# Patient Record
Sex: Male | Born: 1970
Health system: Southern US, Community
[De-identification: ages and names within clinical notes are randomized; demographics above are authoritative.]

## PROBLEM LIST (undated history)

## (undated) DIAGNOSIS — I2510 Atherosclerotic heart disease of native coronary artery without angina pectoris: Secondary | ICD-10-CM

## (undated) DIAGNOSIS — I10 Essential (primary) hypertension: Secondary | ICD-10-CM

## (undated) HISTORY — PX: CARDIAC CATHETERIZATION: SHX172

## (undated) HISTORY — PX: CHOLECYSTECTOMY: SHX55

## (undated) HISTORY — DX: Essential (primary) hypertension: I10

## (undated) HISTORY — PX: GASTRIC BYPASS: SHX52

---

## 1999-03-07 ENCOUNTER — Emergency Department (HOSPITAL_COMMUNITY): Admission: EM | Admit: 1999-03-07 | Discharge: 1999-03-07 | Payer: Self-pay | Admitting: Emergency Medicine

## 2000-07-13 ENCOUNTER — Encounter: Payer: Self-pay | Admitting: Emergency Medicine

## 2000-07-13 ENCOUNTER — Emergency Department (HOSPITAL_COMMUNITY): Admission: EM | Admit: 2000-07-13 | Discharge: 2000-07-13 | Payer: Self-pay | Admitting: Emergency Medicine

## 2000-11-03 ENCOUNTER — Emergency Department (HOSPITAL_COMMUNITY): Admission: EM | Admit: 2000-11-03 | Discharge: 2000-11-03 | Payer: Self-pay | Admitting: Emergency Medicine

## 2002-07-17 ENCOUNTER — Encounter: Payer: Self-pay | Admitting: Emergency Medicine

## 2002-07-17 ENCOUNTER — Emergency Department (HOSPITAL_COMMUNITY): Admission: EM | Admit: 2002-07-17 | Discharge: 2002-07-17 | Payer: Self-pay | Admitting: Emergency Medicine

## 2002-08-25 ENCOUNTER — Encounter (HOSPITAL_COMMUNITY): Admission: RE | Admit: 2002-08-25 | Discharge: 2002-11-23 | Payer: Self-pay | Admitting: Cardiovascular Disease

## 2002-08-25 ENCOUNTER — Encounter: Payer: Self-pay | Admitting: Cardiovascular Disease

## 2002-09-17 ENCOUNTER — Encounter: Payer: Self-pay | Admitting: Cardiovascular Disease

## 2002-09-17 ENCOUNTER — Ambulatory Visit (HOSPITAL_COMMUNITY): Admission: RE | Admit: 2002-09-17 | Discharge: 2002-09-17 | Payer: Self-pay | Admitting: Cardiovascular Disease

## 2003-05-06 ENCOUNTER — Emergency Department (HOSPITAL_COMMUNITY): Admission: EM | Admit: 2003-05-06 | Discharge: 2003-05-06 | Payer: Self-pay | Admitting: Emergency Medicine

## 2003-05-06 ENCOUNTER — Encounter: Payer: Self-pay | Admitting: Emergency Medicine

## 2004-04-18 ENCOUNTER — Emergency Department (HOSPITAL_COMMUNITY): Admission: EM | Admit: 2004-04-18 | Discharge: 2004-04-18 | Payer: Self-pay | Admitting: Family Medicine

## 2004-09-09 ENCOUNTER — Emergency Department (HOSPITAL_COMMUNITY): Admission: EM | Admit: 2004-09-09 | Discharge: 2004-09-09 | Payer: Self-pay | Admitting: Emergency Medicine

## 2004-10-16 ENCOUNTER — Emergency Department (HOSPITAL_COMMUNITY): Admission: EM | Admit: 2004-10-16 | Discharge: 2004-10-16 | Payer: Self-pay | Admitting: Family Medicine

## 2005-09-20 ENCOUNTER — Ambulatory Visit (HOSPITAL_COMMUNITY): Admission: RE | Admit: 2005-09-20 | Discharge: 2005-09-20 | Payer: Self-pay | Admitting: Internal Medicine

## 2005-09-24 ENCOUNTER — Inpatient Hospital Stay (HOSPITAL_COMMUNITY): Admission: EM | Admit: 2005-09-24 | Discharge: 2005-09-27 | Payer: Self-pay | Admitting: Emergency Medicine

## 2005-09-26 ENCOUNTER — Encounter (INDEPENDENT_AMBULATORY_CARE_PROVIDER_SITE_OTHER): Payer: Self-pay | Admitting: Specialist

## 2006-05-23 ENCOUNTER — Emergency Department (HOSPITAL_COMMUNITY): Admission: EM | Admit: 2006-05-23 | Discharge: 2006-05-23 | Payer: Self-pay | Admitting: Family Medicine

## 2006-12-11 ENCOUNTER — Emergency Department (HOSPITAL_COMMUNITY): Admission: EM | Admit: 2006-12-11 | Discharge: 2006-12-11 | Payer: Self-pay | Admitting: Family Medicine

## 2007-03-01 IMAGING — CR DG CHEST 2V
2 series · 2 of 2 positions shown · non-contrast
Comparison: 05/06/03.

CLINICAL DATA: Positive TB skin test. 
 CHEST - 2 VIEW:

[view not recorded (1 of 2)]
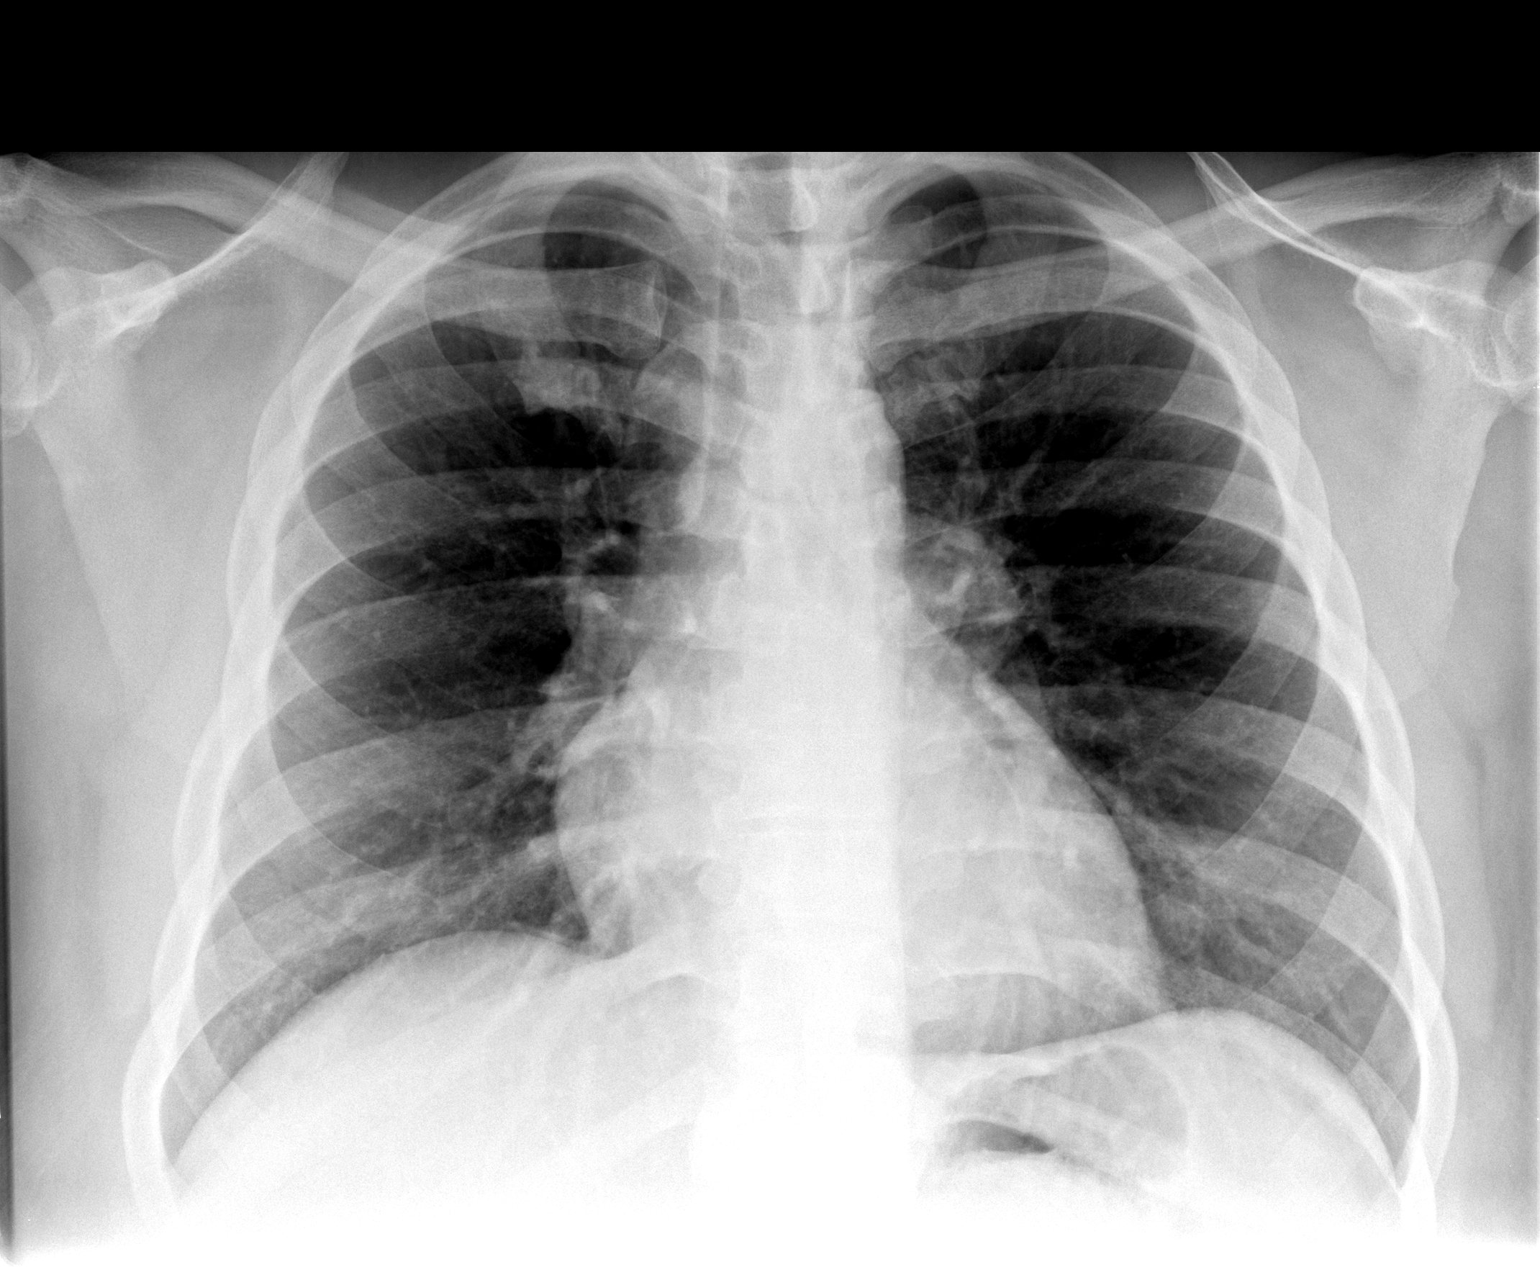

[view not recorded (2 of 2)]
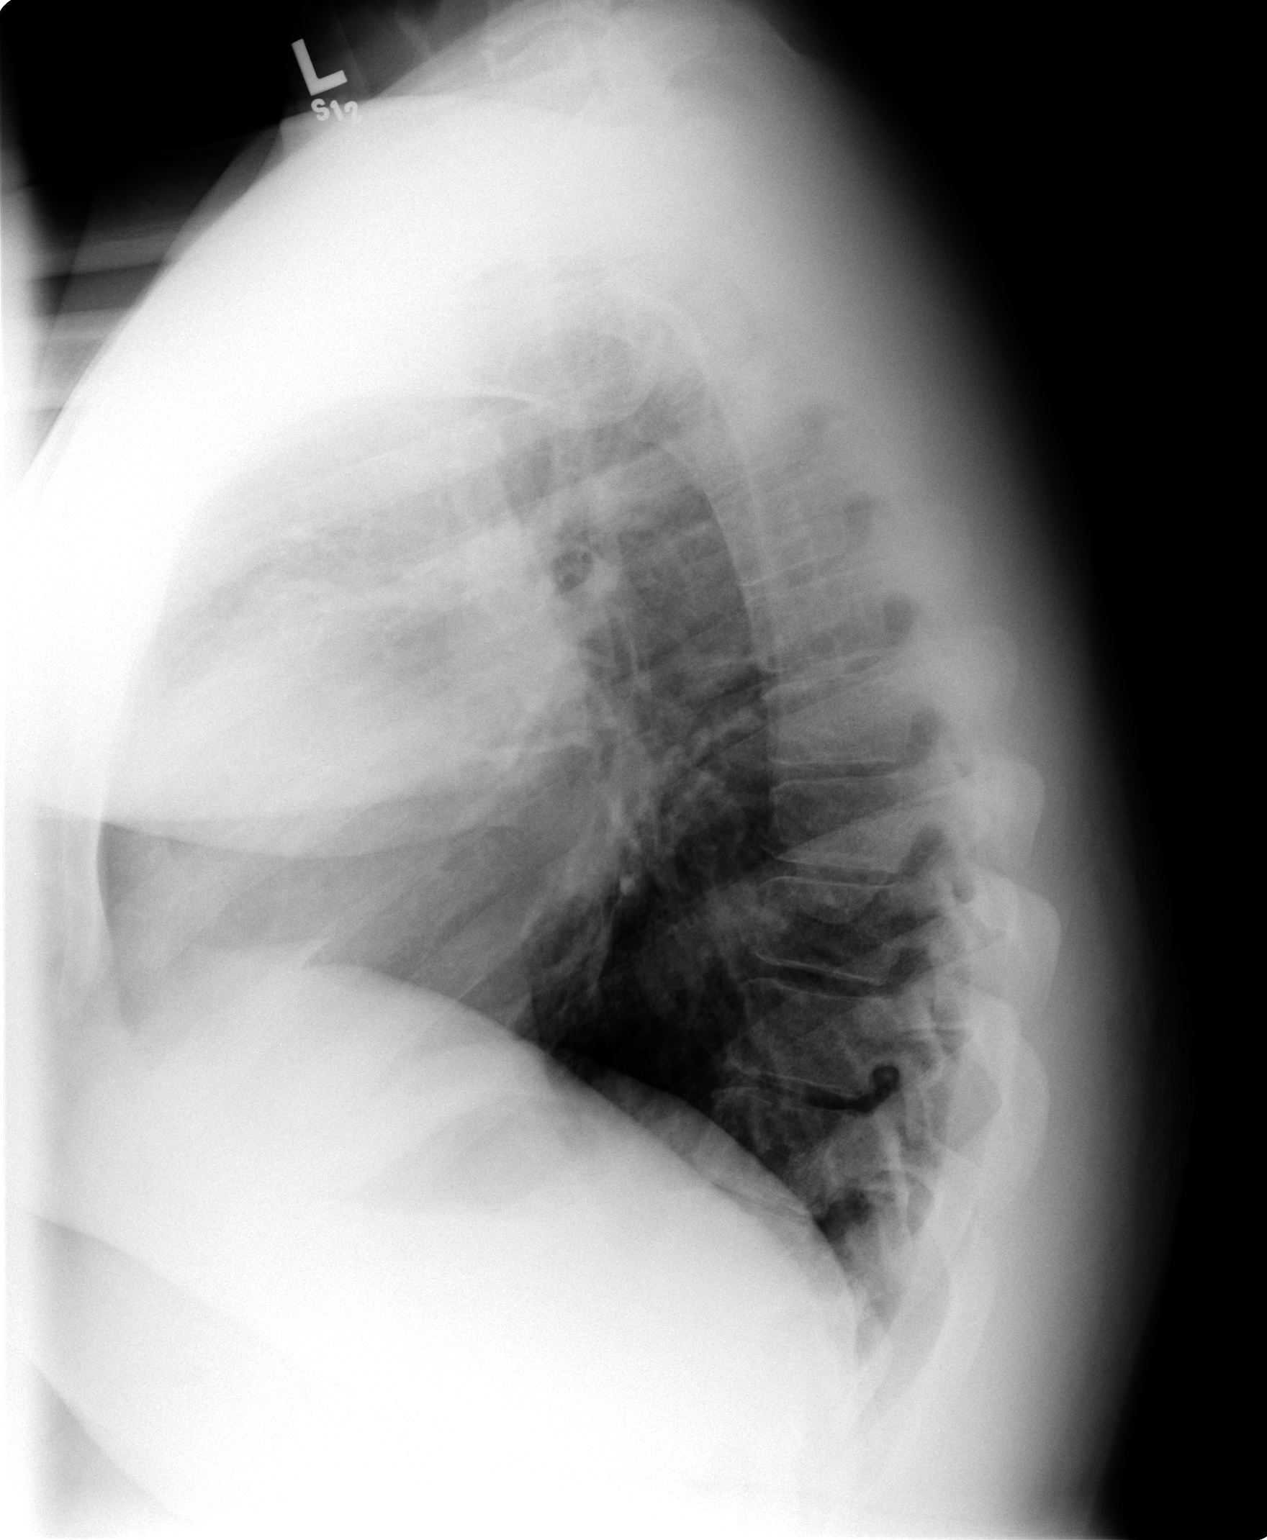

[2 of 2 positions shown; findings below may reference images not displayed]

Two views of the chest show the lungs to be clear.  The heart is within normal limits in size.  Appearance of the left hilum is stable.  No bony abnormality is seen.
IMPRESSION: Stable chest x-ray.  No active lung disease.

## 2007-03-07 IMAGING — RF DG CHOLANGIOGRAM OPERATIVE
1 series · 4 of 4 positions shown · non-contrast
Comparison: none

CLINICAL DATA: Acute cholecystitis. 
 OPERATIVE CHOLANGIOGRAM:
 C-Arm run is reviewed on PACS.  Intra and extrahepatic biliary ductal system normal in caliber with no filling defects or obstruction.

[Series 1: run · 4 of 42 frames shown]
[frame 7/42]
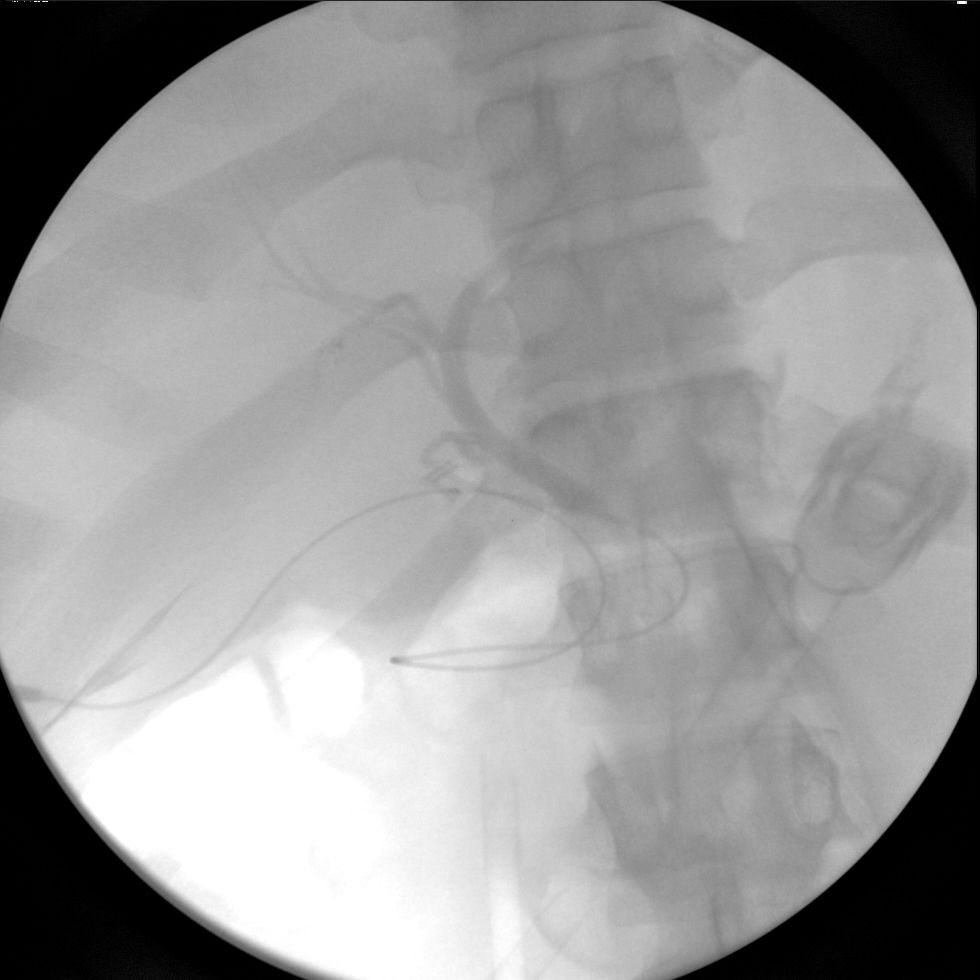
[frame 22/42]
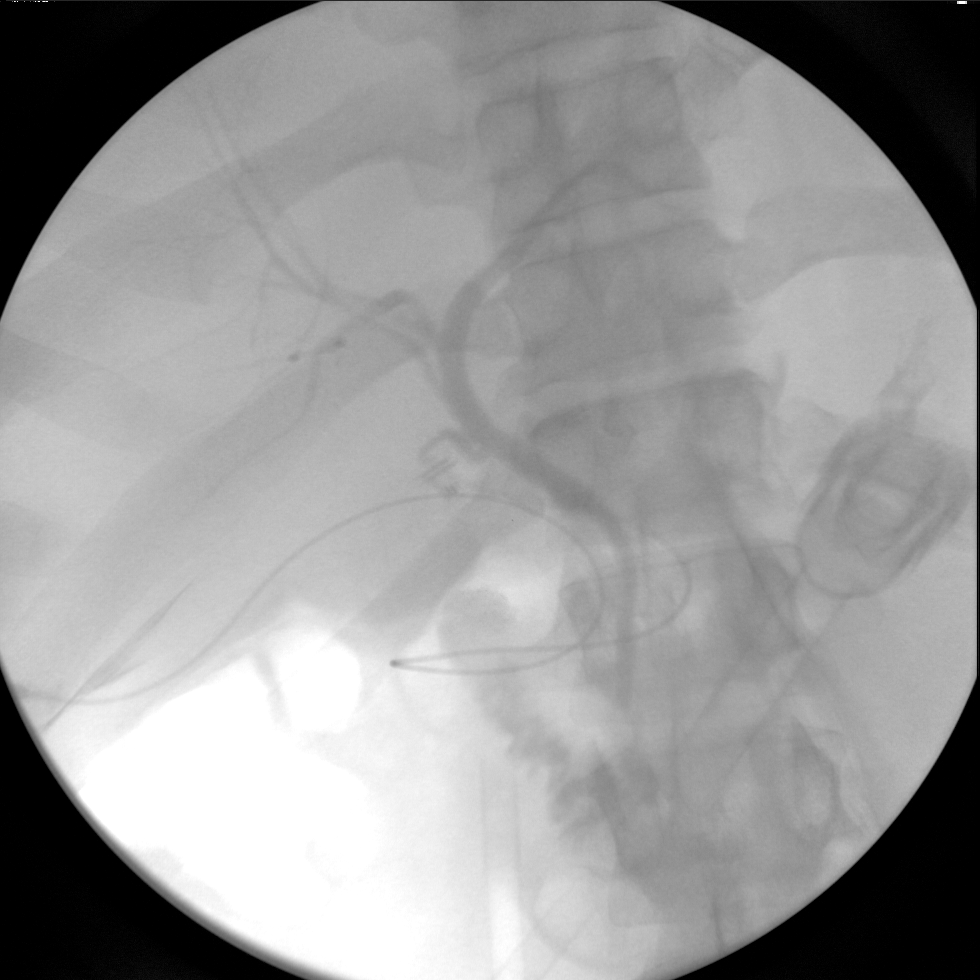
[frame 36/42]
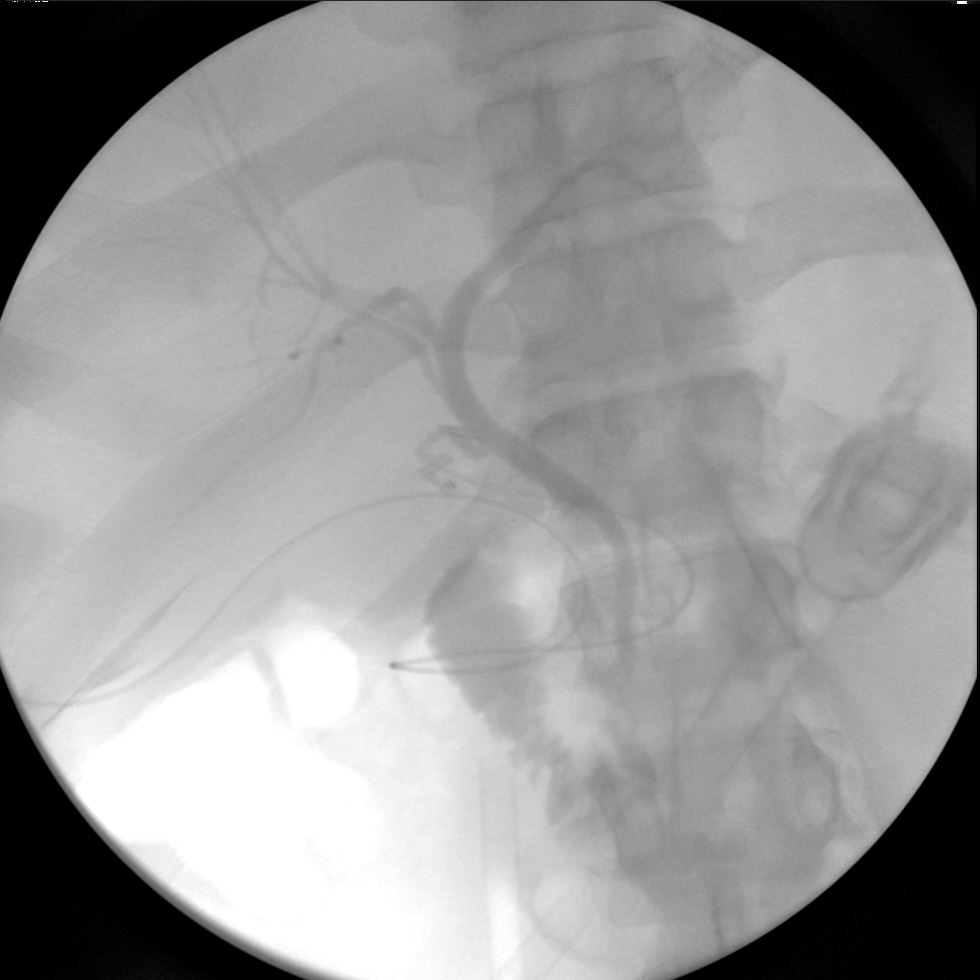
[frame 42/42]
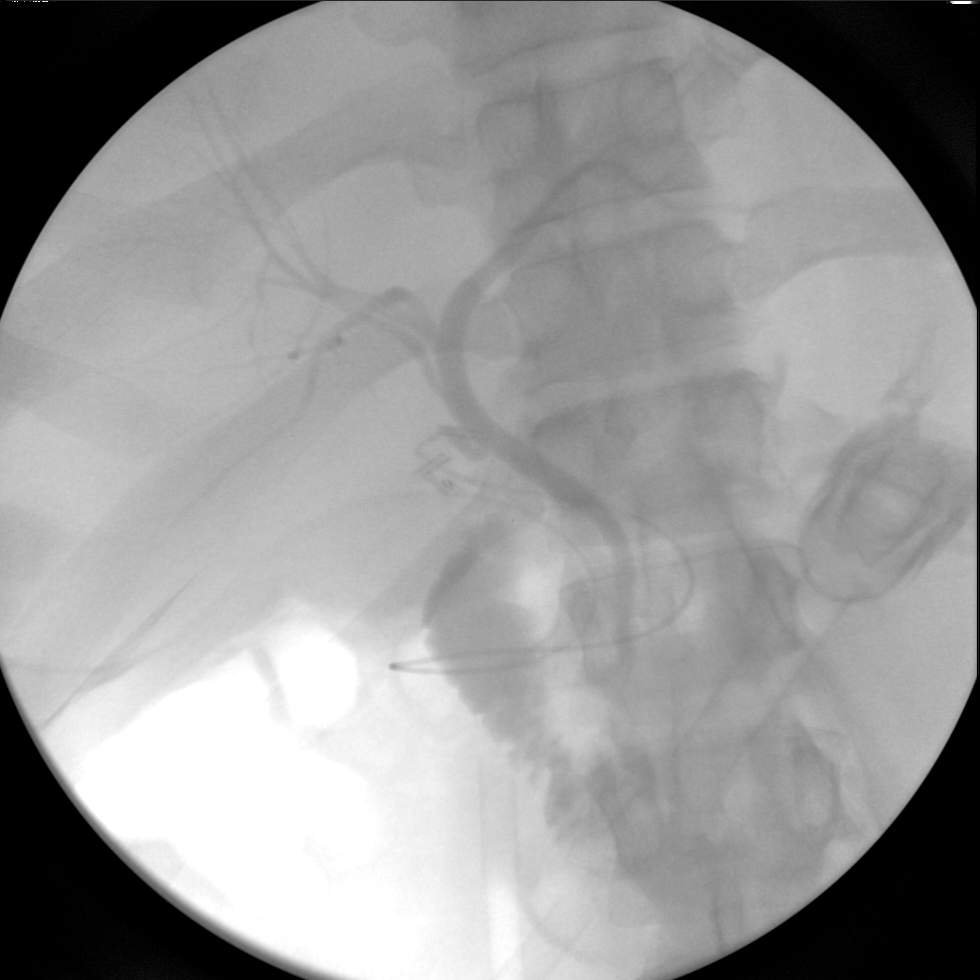

[4 of 4 positions shown; findings below may reference images not displayed]

IMPRESSION: Satisfactory operative cholangiogram.

## 2007-05-15 ENCOUNTER — Emergency Department (HOSPITAL_COMMUNITY): Admission: EM | Admit: 2007-05-15 | Discharge: 2007-05-15 | Payer: Self-pay | Admitting: Family Medicine

## 2011-01-04 NOTE — Op Note (Signed)
[name], [name]:  [id number]   MEDICAL RECORD NO.:  [id number]          PATIENT TYPE:  INP   LOCATION:  4713                         FACILITY:  [facility]   PHYSICIAN:  Cherylynn Ridges, M.D.    DATE OF BIRTH:  1971/05/20   DATE OF PROCEDURE:  09/26/2005  DATE OF DISCHARGE:                                 OPERATIVE REPORT   PREOP DIAGNOSIS:  Acute cholecystitis and cholelithiasis.   POSTOP DIAGNOSIS:  Acute cholecystitis and cholelithiasis.   PROCEDURE:  Laparoscopic cholecystectomy with cholangiogram.   SURGEON:  Cherylynn Ridges, M.D.   ANESTHESIA:  General endotracheal.   ASSISTANT:  Dr. Janee Morn.   ESTIMATED BLOOD LOSS:  Less than 30 mL.   COMPLICATIONS:  None.   CONDITION:  Stable.   FINDINGS:  A moderate acute cholecystitis with omental adhesions. Normal  cholangiogram.   INDICATIONS FOR OPERATION:  The patient is a 40 year old with multiple  stones and acute cholecystitis with abnormal liver function tests; and  increased white blood cell count who comes in now for semi elective  laparoscopic cholecystectomy.   FINDINGS:  The patient had multiple adhesions of the omentum to the  gallbladder wall. The cholangiogram was negative.   OPERATION:  The patient was taken to the operating room, placed on the table  in the supine position. After an adequate general anesthetic was  administered, he was prepped and draped in usual sterile manner exposing  midline and the right upper quadrant. A supraumbilical curvilinear incision  was made using an #11 blade and taken down to the midline fascia. The fascia  was grasped with Kocher clamps and then an incision made between the clamps,  into the fascia using a #15 blade. We then bluntly dissected down into the  peritoneal cavity and then placed a pursestring suture of #0 Vicryl using  the UR-6 needle. We then used a Hasson cannula to pass into the peritoneal  cavity. With the cannula in adequate position  the abdomen was insufflated  with carbon dioxide gas up to a maximal intra-abdominal pressure of 50 mmHg.   Once this was done, two right costal margin 5-mm cannulas and a subxiphoid  an 11/12-mm cannula were passed under direct vision. We then placed the  patient in reverse Trendelenburg; the left-side was turned down; and the  dissection begun. There were multiple adhesions of surrounding structures  and omentum to the dome and the body of the gallbladder all the way down to  the infundibulum. We had to bluntly and sharply dissect these away with  laparoscopic scissors, cautery, and dissectors. We were able to eventually  clear out the dome and retract it towards the right upper quadrant and  anterior abdominal wall as we placed a second grasper on the infundibulum.  This freed up the peritoneum overlying the triangle of Calot, and hepatic  duodenal triangle. We dissected out the cystic duct and the cystic artery;  and we clipped the cystic artery proximally with two clips, and distally  with a single clip. We passed a single clip along with the cystic duct side  of the gallbladder,  onto the cystic duct, and then made a  cholecystodochotomy through stones which had passed into the proximal  portion of the cystic duct had extruded. We removed these stones, and  subsequently passed a Cook catheter into the cholecystodochotomy; performed  a cholangiogram which showed a good flow into the duodenum, no obstruction,  no intraductal defects.   We subsequently removed the clip and the catheter from the  cholecystodochotomy, then endoclipped the distal cystic duct using multiple  endoclips. We then transected the cystic duct; and dissected out the  gallbladder, from its bed, with minimal difficulty, although there was  entrance into the gallbladder with spillage of a small amount of bile and  small stones.  These spilled gallstones were removed using a large Nejat  aspirator; removing and  irrigated most of these substances. This was done  with about 3 liters saline.   Once we had dissected the gallbladder completely, we passed it through an  EndoCatch bag and brought it out through the supraumbilical fascia with  minimal difficulty. We tied off the supraumbilical incision using the figure-  of-eight stitch of pursestring suture of #0 Vicryl. We then irrigated  copiously, removed all fragments of stones, obtained hemostasis; and then  closed by aspirating the fluid above the liver and gas. Each incision site  was injected with 1/4% Marcaine with epinephrine. The skin was closed using  running subcuticular stitch of 4-0 Vicryl. All needle counts, sponge counts,  and instrument counts were correct. Sterile dressings were applied.      Cherylynn Ridges, M.D.  Electronically Signed     JOW/MEDQ  D:  09/26/2005  T:  09/26/2005  Job:  [id number]

## 2011-01-04 NOTE — Consult Note (Signed)
NAMEMITCHELLE, [name]:  [id number]   MEDICAL RECORD NO.:  [id number]          PATIENT TYPE:  INP   LOCATION:  4713                         FACILITY:  [facility]   PHYSICIAN:  Ricki Rodriguez, M.D.  DATE OF BIRTH:  [birth year]   DATE OF CONSULTATION:  09/24/2005  DATE OF DISCHARGE:                                   CONSULTATION   Patient admitted by [facility].   PRINCIPAL DIAGNOSES:  1.  Abnormal EKG.  2.  Acute cholecystitis.  3.  Hypertension.  4.  Obesity.  5.  Mild coronary artery disease.   RECOMMENDATIONS:  1.  This patient has a significant EKG change over a two-year period, hence      will schedule him for a Persantine Myoview stress test and keep him      n.p.o. overnight unless he needs emergent gallbladder operation.  2.  CK, MB and troponin I to rule out MI.  3.  Continue antihypertensive medication Norvasc.  4.  IV fluids.   HISTORY:  This 40 year old black male with a known history of mild coronary  artery disease found on cardiac catheterization done in January 2004 has  lost approximately 140 pounds over a two-year period.  Today he presents to  the emergency room with a five-day history of nausea, abdominal pain, right  upper and lower quadrant area without fever, vomiting or diarrhea.  Admits  to some exertional dyspnea.   PAST MEDICAL HISTORY:  Negative for diabetes.  Positive for hypertension.  No history of smoking, alcohol intake, elevated cholesterol level,  myocardial infarction, exercise, family history of premature coronary artery  disease or street drug use.  Positive history of obesity in spite of losing  140 pounds.   PAST SURGICAL HISTORY:  None.   CURRENT MEDICATIONS:  Norvasc 5 mg daily.   ALLERGIES:  No known drug allergies.   PERSONAL HISTORY:  The patient is married.  He works in Doctor, hospital at [employer].  He is here with his sister, [name].   FAMILY HISTORY:  Mother living,  age 48.  Has hypertension.  Father living,  age 92 and apparently healthy.  The patient has one brother and two sisters.   REVIEW OF SYSTEMS:  The patient admits to significant weight loss.  Positive  history of recent change requiring glasses.  No history of eye surgery, no  history of hearing loss, rhinorrhea or denture use, cough, hemoptysis,  asthma, COPD, pneumonias, palpitations, dizziness, chest pain, edema,  diarrhea, constipation, GI bleed, stomach ulcer, hiatal hernia, hepatitis,  blood transfusion, kidney stones, stroke, seizure, psychiatric admissions,  joint pain or skin rash.  Positive history of some exertional dyspnea and  recent nausea and vomiting for four to five days.   IMMUNIZATION:  Negative for flu shot and pneumonia shot.   PHYSICAL EXAMINATION:  VITAL SIGNS:  Temperature 98.6, pulse 84,  respirations 20, blood pressure 129/79.  The patient is 6 feet 1 inch tall,  weighs 258 pounds.  GENERAL:  He is alert and oriented x3 with an oxygen saturation of 98%.  HEENT:  The  patient is normocephalic, atraumatic.  Has a shaved head with  frontal baldness.  Eyes brown, equal and reacting to light.  Extraocular  movement intact.  Conjunctivae pink.  Sclerae nonicteric.  Ears, nose and  throat:  Grossly unremarkable.  NECK:  No JVD, no bruit, full range of motion of neck.  LUNGS:  Clear bilaterally.  CARDIAC:  Normal S1, S2.  ABDOMEN:  Distended, soft, with right upper quadrant area tenderness on  palpation.  EXTREMITIES:  Negative cyanosis or clubbing, edema.  CENTRAL NERVOUS SYSTEM:  Cranial nerves grossly intact, bilateral equal  grips.   LABORATORY DATA:  WBC elevated at 11,800, normal hemoglobin, hematocrit,  platelet count.  Normal electrolytes with borderline low sodium of 133,  glucose 80, BUN 9, creatinine 1.1.  Liver enzymes:  Slightly elevated AST of  40, ALT elevated at 154, alkaline phosphatase 66, bilirubin elevated at 2.4.  Amylase normal, lipase normal.   Urinalysis:  Positive urobilinogen and  nitrite positive, moderate bilirubin, specific gravity 1.033, pH 6.  Wbc's  in the urine 0-2.  Myoglobin 68, CK-MB less than 1, troponin I less than  0.05.  EKG:  Sinus rhythm with inferolateral ischemic change compared to old EKG of  2004.   Thank you for the consultation.      Ricki Rodriguez, M.D.  Electronically Signed     ASK/MEDQ  D:  09/24/2005  T:  09/25/2005  Job:  [id number]

## 2011-01-04 NOTE — Cardiovascular Report (Signed)
   NAME:  [name], [name]                      ACCOUNT NO.:  [id number]   MEDICAL RECORD NO.:  [id number]                   PATIENT TYPE:  OIB   LOCATION:  2899                                 FACILITY:  [facility]   PHYSICIAN:  Ricki Rodriguez, M.D.               DATE OF BIRTH:  09-08-1970   DATE OF PROCEDURE:  09/17/2002  DATE OF DISCHARGE:                              CARDIAC CATHETERIZATION   PROCEDURE:  Left heart catheterization, selective coronary angiography, left  ventricular function study.   INDICATION:  This 40 year old black male with risk factors of hypertension  and obesity, has abnormal nuclear stress test and symptoms of exertional  angina.   APPROACH:  Right femoral artery using 6-French diagnostic catheters.  Perclose suture applied, additional 5 to 10 minutes of pressure also held  and IV Ancef 1 g ordered.   COMPLICATIONS:  None, however, technician assisted with arterial stick.   HEMODYNAMIC DATA:  The left ventricular pressure was 132 x 10 x 15 mmHg.  The aortic pressure was 132 x 95 x 130 mmHg.   LEFT VENTRICULOGRAM:  The left ventriculogram showed normal left ventricular  systolic function.  The ejection fraction was 60-65%.   CORONARY ANATOMY:  1. The left main coronary artery was short.  2. Left anterior descending coronary artery:  The left anterior descending     coronary artery was normal with a large diagonal 1 vessel and a first     septal perforator vessel.  3. Left circumflex coronary artery:  The left circumflex coronary artery had     ostial 30% narrowing, reduced to 10-20% narrowing post intracoronary 100     mcg nitroglycerin x2.  The patient had chest pain during the dye     injection.  4. Right coronary artery:  The right coronary artery was dominant and a     large vessel with superior angulation off the ostium of the artery,     otherwise, was unremarkable.   IMPRESSION:  1. Mild one-vessel coronary artery disease with some spasm.  2. Normal left ventricular systolic function.  3. Hypertension.    RECOMMENDATION:  This patient will be started on low-acting calcium channel  blocker medication, along with a cholesterol-lowering agent, aspirin, diet  and exercise and he will be given IV Ancef 1 g for the procedure.                                                 Ricki Rodriguez, M.D.    ASK/MEDQ  D:  09/17/2002  T:  09/17/2002  Job:  [id number]   cc:   [facility]

## 2011-01-04 NOTE — H&P (Signed)
[name], [name]:  [id number]   MEDICAL RECORD NO.:  [id number]          PATIENT TYPE:  INP   LOCATION:  1846                         FACILITY:  [facility]   PHYSICIAN:  Cherylynn Ridges, M.D.    DATE OF BIRTH:  April 04, 1971   DATE OF ADMISSION:  09/24/2005  DATE OF DISCHARGE:                                HISTORY & PHYSICAL   CONSULTING PHYSICIAN:  Ricki Rodriguez, M.D.   CHIEF COMPLAINT:  Abdominal pain.   HISTORY OF PRESENT ILLNESS:  [name] is a 40 year old male with history  of hypertension and single-vessel coronary artery disease found on  catheterization in 2004 involving the circumflex 30% lesion found and 20%  lesion after injection with intracoronary nitroglycerin. The patient came to  the ER today after experiencing prolonged epigastric abdominal pain.  In  talking with the patient, he has a history of similar abdominal pain,  epigastric location, not radiating that initially began in November 2006.  He states when he would have these episodes, the pain would last for several  days.  He would have no associated symptoms, no postprandial symptoms, and  these symptoms would spontaneously resolve.  He had an additional episode in  December, and then his most recent episode started this past Friday.  He has  had constant pain with increasing and decreasing severity of pain.  Because  of the discomfort, he has limited his p.o. intake to water only.  He denies  nausea and vomiting, no diarrhea, no postprandial symptoms.  He was  evaluated in the ER today with ultrasound of the abdomen which demonstrated  a contracted gallbladder with multiple stones without ductal dilatation.   On presentation, he has some mild transaminitis with AST of 40 and ALT 154,  alkaline phosphatase 66, total bilirubin 2.4.  White count mildly elevated  at 11,800 with normal neutrophil count.  Amylase and lipase are normal.  Based on these findings, the patient has been  admitted for addition  evaluation and treatment of acute with probable chronic cholecystitis.   REVIEW OF SYSTEMS:  As per History of Present Illness.  Again, the patient  does have a cardiac history.  He denies any chest pain, shortness of breath,  or dyspnea on exertion.  He has no urinary symptoms, no trouble with  starting or stopping his urine, no lower extremity swelling or pain.  No  recent coughs, fevers, chills, or other constitutional symptoms.   FAMILY MEDICAL HISTORY:  Mother has breast cancer history, sister with  diabetes.   SOCIAL HISTORY:  He does not smoke tobacco or use tobacco products.  He  drinks alcohol socially.  He is married with three children.  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  His wife is a Designer, jewellery.   PAST MEDICAL HISTORY:  1.  Hypertension.  2.  10 to 20% nonobstructive lesion in the ostial circumflex.   PAST SURGICAL HISTORY:  The patient thinks he had some sort of surgical  procedure done at age 75, but he does not remember what.   ALLERGIES:  No known drug allergies.   CURRENT MEDICATIONS:  Norvasc 10 mg daily.   PHYSICAL EXAMINATION:  GENERAL:  This is a pleasant male currently  complaining of continuous abdominal pain, mainly epigastric location.  Does  not appear toxic.  VITAL SIGNS:  Temperature 98.3, blood pressure 131/94, pulse 104,  respirations 24.  HEENT:  Normocephalic.  Sclerae not injected.  NECK: Supple, no adenopathy.  NEUROLOGIC:  Alert and oriented, moving all extremities x4. No focal  deficits.  CHEST: Bilateral lungs clear to auscultation.  Respiratory effort not  labored.  Saturating 985 on room air.  CARDIAC:  S1, S2.  There is an S4 gallop.  No obvious murmurs, rubs, or  thrills, Pulses regular and tachycardic at times.  Carotids 2+, no bruits.  The gallop does radiate up to the right carotid.  ABDOMEN: Soft, nondistended.  There are bowel sounds present. He  is slightly  tender in the epigastric region, but he has a significantly positive  Murphy's sign in the right upper quadrant. No obvious hepatosplenomegaly.  No masses, no bruits, no umbilical hernia.  EXTREMITIES:  Symmetrical in appearance without cyanosis or clubbing.  Pulses palpable.   LABORATORY DATA:  White count 11,800, hemoglobin 14.2, hematocrit 42.3,  platelets 342,000.  Neutrophils 75%.  Stool blood negative.  Sodium 132,  potassium 4.7, chloride 100, CO2 24, glucose 80, BUN 9, creatinine 1.1, AST  40, ALT 154, alkaline phosphatase 66, total bilirubin 2.4.  Amylase 115,  lipase 36.  Point-of-care enzymes x1 is negative.  Urinalysis is negative.   DIAGNOSTICS AND ULTRASOUND:  Demonstrates contracted gallbladder with stone,  no ductal dilatation.   EKG shows sinus rhythm with downsloping ST segments in II, III, and aVF,  more pronounced in III and aVF when compared with prior EKG November 2003.  He also has some ST segment elevation in aVL and downsloping STS in V5 and  V6.  Again, these are also more pronounced than previous EKG but could also  be related to the fact that the older EKG has extremely low voltage. The  patient is currently not having chest pain.   IMPRESSION:  1.  Acute on chronic cholecystitis.  2.  Hypertension, controlled.  3.  Single-vessel coronary artery disease with questionable new      electrocardiographic changes.   PLAN:  1.  We will admit him to the telemetry floor to [facility].  2.  Allow clear liquids tonight, n.p.o. after midnight if surgery is      considered for the morning.  3.  We will hydrate aggressively with D5 half normal saline 20 K at 150 an      hour.  4.  With the leukocytosis, we will go ahead and empirically start him on      Unasyn IV q.6 h.  5.  We may need to obtain a GI consult in case an ERCP is indicated first,      noting there was no ductal dilatation on ultrasound, but the LFTs are     elevated.  I  will defer this to Dr. Lindie Spruce.  6.  I have gone ahead and notified Dr. Algie Coffer of the patient's admission      and possible need for surgery within the      next 24 to 48 hours and the fact that he has nonspecific ST changes that      need further evaluation by a cardiologist.  7.  Other recommendations per Dr. Lindie Spruce.  Allison L. Rennis Harding, N.P.      Cherylynn Ridges, M.D.  Electronically Signed    ALE/MEDQ  D:  09/24/2005  T:  09/24/2005  Job:  [id number]   cc:   Ricki Rodriguez, M.D.  Fax: [phone]

## 2011-01-04 NOTE — Cardiovascular Report (Signed)
NAMEGRADYN, SHEIN NO.:  [id number]   MEDICAL RECORD NO.:  [id number]          PATIENT TYPE:  INP   LOCATION:  4713                         FACILITY:  [facility]   PHYSICIAN:  Ricki Rodriguez, M.D.  DATE OF BIRTH:  Jul 04, 1971   DATE OF PROCEDURE:  09/25/2005  DATE OF DISCHARGE:                              CARDIAC CATHETERIZATION   PROCEDURES:  1.  Left heart catheterization.  2.  Selective coronary angiography.  3.  Left ventricular function study.   INDICATIONS FOR PROCEDURE:  This 40 year old black male with risk factors of  hypertension and obesity had abnormal nuclear stress test and abnormal EKG  on admission.   APPROACH:  Right femoral artery a 4 French sheath and catheters.   COMPLICATIONS:  None.   HEMODYNAMIC DATA:  The left ventricular pressure was 135/9/36, and aortic  pressure was 150/89.   CORONARY ANATOMY:  The left main coronary artery was short and unremarkable.   Left anterior descending coronary artery was essentially unremarkable with a  large diagonal #1 vessel and a large first septal perforated vessel.   The left circumflex coronary artery had ostial 20% narrowing, otherwise, was  unremarkable.   The right coronary artery was dominant and essentially unremarkable.  The  posterior lateral branch and posterior descending coronary artery were  smaller vessel and unremarkable.   LEFT VENTRICULOGRAM:  The left ventriculogram showed hyperdynamic wall  motion with ejection fraction of 70%.   IMPRESSION:  1.  Mild one vessel native vessel coronary artery disease.  2.  Normal left ventricular systolic function.  3.  Hypertension.   RECOMMENDATIONS:  This patient will continue with antihypertensive  medications and he will be cleared for his gallbladder surgery.      Ricki Rodriguez, M.D.  Electronically Signed     ASK/MEDQ  D:  09/25/2005  T:  09/25/2005  Job:  [id number]

## 2011-01-04 NOTE — Discharge Summary (Signed)
NAMEJULIO, [name]:  [id number]   MEDICAL RECORD NO.:  [id number]          PATIENT TYPE:  INP   LOCATION:  4713                         FACILITY:  [facility]   PHYSICIAN:  Cherylynn Ridges, M.D.    DATE OF BIRTH:  1971-04-18   DATE OF ADMISSION:  09/24/2005  DATE OF DISCHARGE:  09/27/2005                                 DISCHARGE SUMMARY   CONSULTING PHYSICIAN:  Dr. Algie Coffer with cardiology.   CHIEF COMPLAINT AND REASON FOR ADMISSION:  [name] is a 40 year old  male, history of hypertension and single-vessel coronary artery disease  noted on cardiac catheterization in 2004, nonobstructive, a degree of  vasospasm involved with this vascular lesion.  The patient presented to the  ER on the date of admission with prolonged epigastric pain per symptoms in  December 2006.  His lab work demonstrated mild transaminitis with total  bilirubin of 2.4.  White count 11,800, normal neutrophil count.  Normal  amylase and lipase.  He has been admitted because of suspected acute  cholecystitis.  He did have ultrasound done, which demonstrated a contracted  gallbladder with multiple stones, without ductal dilatation.   ADMISSION DIAGNOSES:  1.  Acute on chronic cholecystitis.  2.  Hypertension.  3.  Single-vessel artery disease with questionable new EKG changes,      downsloping of the ST segments in leads II, III, aVF, change from      November 2003 EKG.   HOSPITAL COURSE:  The patient was admitted to a telemetry unit because of  the questionable EKG changes.  Dr. Algie Coffer was consulted since the patient  would probably require surgical intervention before discharge.  This was  done for cardiac clearance.  Dr. Algie Coffer felt the patient's symptoms were  significant enough that he needed to undergo cardiac catheterization.  This  was done and showed nonobstructive coronary disease.  He had stable ostial  left circumflex disease 20% with a normal LV systolic function,  hyperdynamic  EF of 70%.   From a surgical standpoint, the patient's abdomen remained stable.  He  continued to have some mild abdominal pain and once he was cleared for  surgery, he was taken to the OR on September 26, 2005, where he underwent a  laparoscopic cholecystectomy with intraoperative cholangiogram per Dr.  Lindie Spruce.  By postop day #1, the patient was doing fine, vital signs were  stable.  He had good urine output, abdomen was benign, and he was deemed  appropriate for discharge home and recommended following up with Dr. Lindie Spruce  in two weeks.   FINAL DISCHARGE DIAGNOSES:  1.  Acute cholecystitis, status post laparoscopic cholecystectomy.  2.  Known single-vessel coronary artery disease, status post cardiac      catheterization this admission with nonobstructive disease involving the      ostium of the left circumflex 20% lesion.  3.  Hypertension, moderate control.   DISCHARGE MEDICATIONS:  1.  Resume preoperative medications of Norvasc.  2.  Vicodin as directed for pain.   FOLLOW-UP:  He is to call Dr. Dixon Boos office to be seen in two weeks.  Allison L. Rennis Harding, N.P.      Cherylynn Ridges, M.D.  Electronically Signed    ALE/MEDQ  D:  11/06/2005  T:  11/07/2005  Job:  [id number]   cc:   Ricki Rodriguez, M.D.  Fax: [phone]

## 2013-08-21 ENCOUNTER — Ambulatory Visit (INDEPENDENT_AMBULATORY_CARE_PROVIDER_SITE_OTHER): Payer: Commercial Managed Care - PPO | Admitting: Internal Medicine

## 2013-08-21 VITALS — BP 160/100 | HR 60 | Temp 98.8°F | Resp 16 | Ht 72.0 in | Wt 280.0 lb

## 2013-08-21 DIAGNOSIS — J9801 Acute bronchospasm: Secondary | ICD-10-CM

## 2013-08-21 DIAGNOSIS — J988 Other specified respiratory disorders: Secondary | ICD-10-CM

## 2013-08-21 DIAGNOSIS — J22 Unspecified acute lower respiratory infection: Secondary | ICD-10-CM

## 2013-08-21 DIAGNOSIS — IMO0001 Reserved for inherently not codable concepts without codable children: Secondary | ICD-10-CM

## 2013-08-21 DIAGNOSIS — R030 Elevated blood-pressure reading, without diagnosis of hypertension: Secondary | ICD-10-CM

## 2013-08-21 DIAGNOSIS — Z6837 Body mass index (BMI) 37.0-37.9, adult: Secondary | ICD-10-CM

## 2013-08-21 MED ORDER — AZITHROMYCIN 500 MG PO TABS: 500.0000 mg | ORAL_TABLET | Freq: Every day | ORAL | Status: DC

## 2013-08-21 MED ORDER — PREDNISONE 20 MG PO TABS: ORAL_TABLET | ORAL | Status: DC

## 2013-08-21 NOTE — Progress Notes (Signed)
   Subjective:    Patient ID: [name], male    DOB: 01/31/1971, 43 y.o.   MRN: [id number]  HPI progressive illness for the last 2 weeks Cough continues to increase during the daytime hours and gets worse with activity or laughing or exertion Sleeps okay No fever chills or night sweats Mouth shortness of breath with activity No history of asthma Started with sore throat and fever but that has resolved No sinus congestion    Review of Systems Noncontributory    Objective:   Physical Exam BP 160/100  Pulse 60  Temp(Src) 98.8 F (37.1 C) (Oral)  Resp 16  Ht 6' (1.829 m)  Wt 280 lb (127.007 kg)  BMI 37.97 kg/m2  SpO2 98% HEENT clear except boggy turbinates No nodes Heart regular without murmur Lower lungs with wheezing bilaterally on forced expiration/rhonchi at the bases Extremities no edema       Assessment & Plan:  Lower resp. tract infection  Acute bronchospasm  reactive airway disease  Elevated blood pressure- needs followup for evaluation Meds ordered this encounter  Medications  . azithromycin (ZITHROMAX) 500 MG tablet    Sig: Take 1 tablet (500 mg total) by mouth daily.    Dispense:  5 tablet    Refill:  0  . predniSONE (DELTASONE) 20 MG tablet    Sig: 4/4/3/3/2/2/1/1 single daily dose for 8 days    Dispense:  20 tablet    Refill:  0

## 2014-10-11 ENCOUNTER — Emergency Department (HOSPITAL_BASED_OUTPATIENT_CLINIC_OR_DEPARTMENT_OTHER)
Admission: EM | Admit: 2014-10-11 | Discharge: 2014-10-11 | Disposition: A | Discharge status: Home / Self Care | Payer: BLUE CROSS/BLUE SHIELD | Attending: Emergency Medicine | Admitting: Emergency Medicine

## 2014-10-11 ENCOUNTER — Encounter (HOSPITAL_BASED_OUTPATIENT_CLINIC_OR_DEPARTMENT_OTHER): Payer: Self-pay | Admitting: *Deleted

## 2014-10-11 DIAGNOSIS — Z951 Presence of aortocoronary bypass graft: Secondary | ICD-10-CM | POA: Diagnosis not present

## 2014-10-11 DIAGNOSIS — L0231 Cutaneous abscess of buttock: Secondary | ICD-10-CM

## 2014-10-11 DIAGNOSIS — L0501 Pilonidal cyst with abscess: Secondary | ICD-10-CM | POA: Diagnosis not present

## 2014-10-11 MED ORDER — LIDOCAINE-EPINEPHRINE 2 %-1:100000 IJ SOLN
20.0000 mL | Freq: Once | INTRAMUSCULAR | Status: AC
  Administered 2014-10-11: 20 mL via INTRADERMAL
  Filled 2014-10-11: qty 1

## 2014-10-11 MED ORDER — CLINDAMYCIN HCL 300 MG PO CAPS: 300.0000 mg | ORAL_CAPSULE | Freq: Three times a day (TID) | ORAL | Status: DC

## 2014-10-11 NOTE — Discharge Instructions (Signed)
You have 2 separate infections process. Both areas had mild pus that came out and some additional fluid drainage -which we suspect is pre-abscess fluid. Antibiotics prescribed. See the urgent care or your primaty doctor in 2 days for packing removal. See [facility] team in 7-10 days, if the symptoms are not responding to antibiotics and there is persistent pain and swelling.   Pilonidal Cyst A pilonidal cyst occurs when hairs get trapped (ingrown) beneath the skin in the crease between the buttocks over your sacrum (the bone under that crease). Pilonidal cysts are most common in young men with a lot of body hair. When the cyst is ruptured (breaks) or leaking, fluid from the cyst may cause burning and itching. If the cyst becomes infected, it causes a painful swelling filled with pus (abscess). The pus and trapped hairs need to be removed (often by lancing) so that the infection can heal. However, recurrence is common and an operation may be needed to remove the cyst. HOME CARE INSTRUCTIONS   If the cyst was NOT INFECTED:  Keep the area clean and dry. Bathe or shower daily. Wash the area well with a germ-killing soap. Warm tub baths may help prevent infection and help with drainage. Dry the area well with a towel.  Avoid tight clothing to keep area as moisture free as possible.  Keep area between buttocks as free of hair as possible. A depilatory may be used.  If the cyst WAS INFECTED and needed to be drained:  Your caregiver packed the wound with gauze to keep the wound open. This allows the wound to heal from the inside outwards and continue draining.  Return for a wound check in 1 day or as suggested.  If you take tub baths or showers, repack the wound with gauze following them. Sponge baths (at the sink) are a good alternative.  If an antibiotic was ordered to fight the infection, take as directed.  Only take over-the-counter or prescription medicines for pain, discomfort, or  fever as directed by your caregiver.  After the drain is removed, use sitz baths for 20 minutes 4 times per day. Clean the wound gently with mild unscented soap, pat dry, and then apply a dry dressing. SEEK MEDICAL CARE IF:   You have increased pain, swelling, redness, drainage, or bleeding from the area.  You have a fever.  You have muscles aches, dizziness, or a general ill feeling. Document Released: 08/02/2000 Document Revised: 10/28/2011 Document Reviewed: 09/30/2008 [facility] Patient Information 2015 [place], [place]. This information is not intended to replace advice given to you by your health care provider. Make sure you discuss any questions you have with your health care provider.  Abscess Care After An abscess (also called a boil or furuncle) is an infected area that contains a collection of pus. Signs and symptoms of an abscess include pain, tenderness, redness, or hardness, or you may feel a moveable soft area under your skin. An abscess can occur anywhere in the body. The infection may spread to surrounding tissues causing cellulitis. A cut (incision) by the surgeon was made over your abscess and the pus was drained out. Gauze may have been packed into the space to provide a drain that will allow the cavity to heal from the inside outwards. The boil may be painful for 5 to 7 days. Most people with a boil do not have high fevers. Your abscess, if seen early, may not have localized, and may not have been lanced. If not, another  appointment may be required for this if it does not get better on its own or with medications. HOME CARE INSTRUCTIONS   Only take over-the-counter or prescription medicines for pain, discomfort, or fever as directed by your caregiver.  When you bathe, soak and then remove gauze or iodoform packs at least daily or as directed by your caregiver. You may then wash the wound gently with mild soapy water. Repack with gauze or do as your caregiver directs. SEEK  IMMEDIATE MEDICAL CARE IF:   You develop increased pain, swelling, redness, drainage, or bleeding in the wound site.  You develop signs of generalized infection including muscle aches, chills, fever, or a general ill feeling.  An oral temperature above 102 F (38.9 C) develops, not controlled by medication. See your caregiver for a recheck if you develop any of the symptoms described above. If medications (antibiotics) were prescribed, take them as directed. Document Released: 02/21/2005 Document Revised: 10/28/2011 Document Reviewed: 10/19/2007 [facility] Patient Information 2015 [place], [place]. This information is not intended to replace advice given to you by your health care provider. Make sure you discuss any questions you have with your health care provider.

## 2014-10-11 NOTE — ED Notes (Signed)
Abscess to buttocks x 6 days

## 2014-10-11 NOTE — ED Notes (Signed)
Abscess to rt buttock x 6 days (hard area)

## 2014-10-11 NOTE — ED Provider Notes (Signed)
CSN: [id number]     Arrival date & time 10/11/14  0020 History   This chart was scribed for Derwood Kaplan, MD by Haywood Pao, ED Scribe. The patient was seen in MH01/[facility] and the patient's care was started at 12:53 AM.   Chief Complaint  Patient presents with  . Abscess   HPI  HPI Comments: [name] is a 44 y.o. male who presents to the Emergency Department complaining of an abscess onset on 5 days. The abscess has grown and become more painful since. No Hx of abscess. No n/v/f/c. No hx of diabetes.  History reviewed. No pertinent past medical history. Past Surgical History  Procedure Laterality Date  . Gastric bypass    . Coronary artery bypass graft     Family History  Problem Relation Age of Onset  . Cancer Mother   . Healthy Father   . Diabetes Sister   . Healthy Brother   . Multiple sclerosis Sister    History  Substance Use Topics  . Smoking status: Never Smoker   . Smokeless tobacco: Not on file  . Alcohol Use: No    Review of Systems  Musculoskeletal: Positive for myalgias.  Skin: Positive for rash and wound.  Allergic/Immunologic: Negative for immunocompromised state.  Hematological: Does not bruise/bleed easily.    Allergies  Review of patient's allergies indicates no known allergies.  Home Medications   Prior to Admission medications   Medication Sig Start Date End Date Taking? Authorizing Provider  clindamycin (CLEOCIN) 300 MG capsule Take 1 capsule (300 mg total) by mouth 3 (three) times daily. 10/11/14   Jontae Adebayo Rhunette Croft, MD   BP 134/78 mmHg  Pulse 77  Temp(Src) 98.3 F (36.8 C)  Resp 16  Ht 6' (1.829 m)  Wt 287 lb (130.182 kg)  BMI 38.92 kg/m2  SpO2 100% Physical Exam  Constitutional: He is oriented to person, place, and time. He appears well-developed and well-nourished.  HENT:  Head: Normocephalic and atraumatic.  Eyes: EOM are normal.  Neck: Normal range of motion.  Cardiovascular: Normal rate.   Pulmonary/Chest: Effort  normal.  Musculoskeletal: Normal range of motion.  Neurological: He is alert and oriented to person, place, and time.  Skin: Skin is warm and dry.  On the left gluteal muscle 8 cm diameter indurated lesion. Lateral to that, there is an abscess at the gluteal cleft. No fluctuance. No surrounding erythema or drainage on palpation.  Psychiatric: He has a normal mood and affect. His behavior is normal.  Nursing note and vitals reviewed.   ED Course  Procedures  DIAGNOSTIC STUDIES: Oxygen Saturation is 100% on room air, normal by my interpretation.    COORDINATION OF CARE: 12:56 AM Discussed treatment plan with pt at bedside and pt agreed to plan.  Labs Review Labs Reviewed - No data to display  Imaging Review No results found.   EKG Interpretation None     INCISION AND DRAINAGE Performed by: Derwood Kaplan Consent: Verbal consent obtained. Risks and benefits: risks, benefits and alternatives were discussed Type: abscess  Body area: gluteal cleaft  Anesthesia: local infiltration  Incision was made with a scalpel.  Local anesthetic: lidocaine 1 % with epinephrine  Anesthetic total: 5 ml  Complexity: complex Blunt dissection to break up loculations  Drainage: purulent  Drainage amount: mild  Packing material: 1/4 in iodoform gauze  Patient tolerance: Patient tolerated the procedure well with no immediate complications.      INCISION AND DRAINAGE Performed by: Derwood Kaplan Consent:  Verbal consent obtained. Risks and benefits: risks, benefits and alternatives were discussed Type: abscess  Body area: L gluteus  Anesthesia: local infiltration  Incision was made with a scalpel.  Local anesthetic: lidocaine 1 % with epinephrine  Anesthetic total: 3  ml  Complexity: complex Blunt dissection to break up loculations  Drainage: purulent  Drainage amount: mild  Packing material: 1/4 in iodoform gauze  Patient tolerance: Patient tolerated the  procedure well with no immediate complications.      MDM   Final diagnoses:  Pilonidal cyst with abscess  Gluteal abscess   I personally performed the services described in this documentation, which was scribed in my presence. The recorded information has been reviewed and is accurate.  Pt comes in with cc of abscess. Has a pilonidal cyst and also gluteal abscess. The cyst had mild purulent drainage, and same with the gluteal region. Suspect that there is phlegmon like process going. VSS and WNL, no diabetes hx and based on exam low concerns for deep infection. Pt will be asked to get wound rechecked in 2 days and to see [facility] if symptoms are getting worse.       Derwood KaplanAnkit Kenisha Lynds, MD 10/11/14 [phone]

## 2015-08-05 ENCOUNTER — Encounter: Admit: 2015-08-05

## 2015-08-05 ENCOUNTER — Inpatient Hospital Stay
Admit: 2015-08-05 | Discharge: 2015-08-05 | Disposition: A | Discharge status: Home / Self Care | Attending: Emergency Medicine

## 2015-08-05 DIAGNOSIS — S4992XA Unspecified injury of left shoulder and upper arm, initial encounter: Secondary | ICD-10-CM

## 2015-08-05 MED ORDER — OXYCODONE-ACETAMINOPHEN 5-325 MG PO TABS
5-325 MG | Freq: Once | ORAL | Status: AC
Start: 2015-08-05 — End: 2015-08-05
  Administered 2015-08-05: 13:00:00 2 via ORAL

## 2015-08-05 MED ORDER — IBUPROFEN 800 MG PO TABS
800 MG | ORAL_TABLET | Freq: Three times a day (TID) | ORAL | 0 refills | Status: AC | PRN
Start: 2015-08-05 — End: ?

## 2015-08-05 MED ORDER — CYCLOBENZAPRINE HCL 10 MG PO TABS
10 MG | ORAL_TABLET | Freq: Three times a day (TID) | ORAL | 0 refills | Status: AC | PRN
Start: 2015-08-05 — End: 2015-08-15

## 2015-08-05 MED ORDER — OXYCODONE-ACETAMINOPHEN 5-325 MG PO TABS
5-325 MG | ORAL_TABLET | Freq: Four times a day (QID) | ORAL | 0 refills | Status: AC | PRN
Start: 2015-08-05 — End: 2015-08-12

## 2015-08-05 MED FILL — PERCOCET 5-325 MG PO TABS: 5-325 MG | ORAL | Qty: 2

## 2015-08-05 NOTE — ED Notes (Signed)
Pt ambulating to xray.     Toya SmothersMelissa X Jalaysha Skilton, RN  08/05/15 [phone]

## 2015-08-05 NOTE — ED Notes (Signed)
Pt given d/c, f/u instructions. He verbalized understanding, was also given prescriptions.  Pt ambulatory to the lobby with spouse.     Toya SmothersMelissa X Ayane Delancey, RN  08/05/15 [phone]

## 2015-08-05 NOTE — ED Provider Notes (Signed)
Emergency Department provider note    CHIEF COMPLAINT  Shoulder Pain      HISTORY OF PRESENT ILLNESS  Ian Little is a 44 y.o. male  who presents with complaints of left shoulder pain. He slipped while walking down the stairs on the ice yesterday. He landed on his right side, but immediately felt pain in his left shoulder. He is not sure if his shoulder hit something when he fell. He also notes some mild wrist pain.  It is localized to the top of the shoulder and the [facility] joint. It is worsened with movement and he describes a throbbing while at rest. His range of motion has decreased since the accident. It does not radiate. He denies any paresthesias. He reports no previous injury.  He denies any chest pain or shortness of breath.      No other complaints, modifying factors or associated symptoms.     I have reviewed the following from the nursing documentation.    Past Medical History   Diagnosis Date   ??? Hypertension      Past Surgical History   Procedure Laterality Date   ??? Cholecystectomy       No family history on file.  Social History     Social History   ??? Marital status: Single     Spouse name: N/A   ??? Number of children: N/A   ??? Years of education: N/A     Occupational History   ??? Not on file.     Social History Main Topics   ??? Smoking status: Never Smoker   ??? Smokeless tobacco: Not on file   ??? Alcohol use Yes   ??? Drug use: Not on file   ??? Sexual activity: Not on file     Other Topics Concern   ??? Not on file     Social History Narrative   ??? No narrative on file     No current facility-administered medications for this encounter.      Current Outpatient Prescriptions   Medication Sig Dispense Refill   ??? oxyCODONE-acetaminophen (PERCOCET) 5-325 MG per tablet Take 1-2 tablets by mouth every 6 hours as needed for Pain  WARNING:  May cause drowsiness.  May impair ability to operate vehicles or machinery.  Do not use in combination with alcohol.. 10 tablet 0   ??? ibuprofen (ADVIL;MOTRIN) 800 MG tablet Take 1  tablet by mouth every 8 hours as needed for Pain 30 tablet 0   ??? cyclobenzaprine (FLEXERIL) 10 MG tablet Take 1 tablet by mouth 3 times daily as needed for Muscle spasms 30 tablet 0     No Known Allergies    REVIEW OF SYSTEMS  Negative ??10 except as noted in the HPI.    PHYSICAL EXAM  Visit Vitals   ??? BP (!) 162/102   ??? Pulse 79   ??? Temp 99.1 ??F (37.3 ??C) (Oral)   ??? Resp 12   ??? Ht 6\' 1"  (1.854 m)   ??? Wt 131.5 kg (290 lb)   ??? SpO2 100%   ??? BMI 38.26 kg/m2     GENERAL APPEARANCE: Awake and alert. Cooperative. No acute distress.  HEAD: Normocephalic. Atraumatic.  EYES: PERRL. EOM's grossly intact.   ENT: Mucous membranes are moist.  Uvula midline.    NECK: Supple.  Full range of motion. No midline cervical tenderness area no tenderness to palpation of the trapezii  HEART: RRR. No murmurs, rubs or gallops.  LUNGS: Respirations unlabored. CTAB. Good  air exchange. Speaking comfortably in full sentences.   ABDOMEN: Soft. Non-distended.  EXTREMITIES: No peripheral edema. All extremities neurovascularly intact. Left shoulder: There is point tenderness at the a.c. Joint.  There is tenderness to palpation over the upper bicep.  No tenderness to palpation of the elbow.  There is some pain with flexion and extension of the wrist.  He has good grip.  No pain with supination or pronation or flexion and extension at the elbow.  Range of motion is limited at the shoulder.  He has pain with extension to 45?? as well as abduction to 45??.  SKIN: Warm and dry. No acute rashes.   NEUROLOGICAL: Alert and oriented. CN's 2-12 intact. No gross facial drooping. Strength 5/5, sensation intact.  Gait normal.   PSYCHIATRIC: Normal mood and affect.        RADIOLOGY      ?? XR Shoulder Left Standard (Final result) Result time: 08/05/15 08:12:55   ?? Final result by Varney Baas., MD (08/05/15 08:12:55)   ?? Impression: ??   ?? Impression: Mild degenerative changes of left shoulder.   ??   ?? Narrative: ??   ?? Left shoulder    History:  Trauma    Findings: The left humeral head is in normal position. There is  mild bony spurring involving the left humeral head and left  glenoid.    There is mild bony spurring of left acromioclavicular joint.    There is no bony destructive process or fracture.                  ED COURSE/MDM  Patient seen and evaluated. Patient presents with left shoulder pain after he fell.  Patient slipped on ice while walking down the stairs.  He states that he fell on his right shoulder.  After some questioning however patient is able to recall of the events in detail and states that he was holding his cell phone in his left hand.  When he slipped and he fell back hitting his left shoulder and then bouncing in landing on his right side.  On exam is limited range of motion secondary to pain and point tenderness over the a.c. Joint and upper bicep.  X-ray imaging was obtained which showed some mild degenerative changes of the left shoulder.  Patient reports during the incident and felt like he dislocated his shoulder and his shoulder popped back in.  I suspect he may subluxed his shoulder.  At this time I have recommended a sling and pain control and have given him some exercises to perform at home to increase his range of motion.  Should symptoms persist or worsen he has been referred to orthopedics for outpatient follow-up.  He will return for any worsening symptoms.  He is agreeable with plan..     Old records reviewed. Labs and imaging reviewed and results discussed.           CLINICAL IMPRESSION  1. Shoulder injury, left, initial encounter        Blood pressure (!) 162/102, pulse 79, temperature 99.1 ??F (37.3 ??C), temperature source Oral, resp. rate 12, height  (1.854 m), weight 131.5 kg (290 lb), SpO2 100 %.    DISPOSITION  Ian Little was discharged to home in stable condition.     Comment: Please note this report has been produced using speech recognition software and may contain errors related to that system  including errors in grammar, punctuation, and spelling, as well as  words and phrases that may be inappropriate. If there are any questions or concerns please feel free to contact the dictating provider for clarification.       Birdie HopesAlison P Chere Babson, MD  08/05/15 0830

## 2015-08-05 NOTE — ED Notes (Signed)
Pt states "a little relief" in pain at time of discharge.  He reports the pain is at it's worst upon movement.  Sling placed by resp therapist.     Toya SmothersMelissa X Enid Maultsby, RN  08/05/15 [phone]

## 2016-01-22 ENCOUNTER — Emergency Department (HOSPITAL_BASED_OUTPATIENT_CLINIC_OR_DEPARTMENT_OTHER): Payer: BLUE CROSS/BLUE SHIELD

## 2016-01-22 ENCOUNTER — Encounter (HOSPITAL_BASED_OUTPATIENT_CLINIC_OR_DEPARTMENT_OTHER): Payer: Self-pay | Admitting: *Deleted

## 2016-01-22 ENCOUNTER — Emergency Department (HOSPITAL_BASED_OUTPATIENT_CLINIC_OR_DEPARTMENT_OTHER)
Admission: EM | Admit: 2016-01-22 | Discharge: 2016-01-22 | Disposition: A | Discharge status: Home / Self Care | Payer: BLUE CROSS/BLUE SHIELD | Attending: Emergency Medicine | Admitting: Emergency Medicine

## 2016-01-22 DIAGNOSIS — M25512 Pain in left shoulder: Secondary | ICD-10-CM | POA: Diagnosis present

## 2016-01-22 DIAGNOSIS — R030 Elevated blood-pressure reading, without diagnosis of hypertension: Secondary | ICD-10-CM | POA: Diagnosis not present

## 2016-01-22 DIAGNOSIS — IMO0001 Reserved for inherently not codable concepts without codable children: Secondary | ICD-10-CM

## 2016-01-22 DIAGNOSIS — I2510 Atherosclerotic heart disease of native coronary artery without angina pectoris: Secondary | ICD-10-CM | POA: Diagnosis not present

## 2016-01-22 HISTORY — DX: Atherosclerotic heart disease of native coronary artery without angina pectoris: I25.10

## 2016-01-22 MED ORDER — NAPROXEN 500 MG PO TABS: 500.0000 mg | ORAL_TABLET | Freq: Two times a day (BID) | ORAL | Status: DC | PRN

## 2016-01-22 MED FILL — NAPROXEN 500 MG TABLET: 500 | 15 days supply | Qty: 30 | Fill #0

## 2016-01-22 NOTE — ED Notes (Signed)
He fell down steps 6 months ago. He had negative xrays. He had been unable to lift his left arm above chest level since the fall. He never followed up with anyone.

## 2016-01-22 NOTE — ED Provider Notes (Signed)
CSN: [id number]     Arrival date & time 01/22/16  1126 History   First MD Initiated Contact with Patient 01/22/16 1214     Chief Complaint  Patient presents with  . Shoulder Pain    (Consider location/radiation/quality/duration/timing/severity/associated sxs/prior Treatment) Patient is a 45 y.o. male presenting with shoulder pain. The history is provided by the patient and medical records. No language interpreter was used.  Shoulder Pain Associated symptoms: no fever     [name] is a 45 y.o. male  with a PMH of CAD who presents to the Emergency Department complaining of persistent aching left shoulder pain x 6 months. Patient states 6 months ago he fell down the stairs, injuring his left shoulder. He was seen at that time where he had negative x-rays and was treated with a sling and anti-inflammatories. He has had decreased range of motion and pain since injury. Worse with movement, better when keeping shoulder still. Took anti-inflammatories for the first week, but no meds since. No fever, chest pain, sob, diaphoresis.   Past Medical History  Diagnosis Date  . Coronary artery disease    Past Surgical History  Procedure Laterality Date  . Gastric bypass    . Coronary artery bypass graft     Family History  Problem Relation Age of Onset  . Cancer Mother   . Healthy Father   . Diabetes Sister   . Healthy Brother   . Multiple sclerosis Sister    Social History  Substance Use Topics  . Smoking status: Never Smoker   . Smokeless tobacco: None  . Alcohol Use: No    Review of Systems  Constitutional: Negative for fever.  Respiratory: Negative for shortness of breath.   Cardiovascular: Negative for chest pain.  Musculoskeletal: Positive for arthralgias.      Allergies  Review of patient's allergies indicates no known allergies.  Home Medications   Prior to Admission medications   Medication Sig Start Date End Date Taking? Authorizing Provider  clindamycin  (CLEOCIN) 300 MG capsule Take 1 capsule (300 mg total) by mouth 3 (three) times daily. 10/11/14   Derwood KaplanAnkit Nanavati, MD  naproxen (NAPROSYN) 500 MG tablet Take 1 tablet (500 mg total) by mouth 2 (two) times daily as needed. 01/22/16   Burdette Gergely Pilcher Alisan Dokes, PA-C   BP 163/108 mmHg  Pulse 71  Temp(Src) 99 F (37.2 C) (Oral)  Resp 18  Ht 6\' 1"  (1.854 m)  Wt 131.543 kg  BMI 38.27 kg/m2  SpO2 100% Physical Exam  Constitutional: He is oriented to person, place, and time. He appears well-developed and well-nourished.  Alert and in no acute distress  HENT:  Head: Normocephalic and atraumatic.  Cardiovascular: Normal rate, regular rhythm, normal heart sounds and intact distal pulses.  Exam reveals no gallop and no friction rub.   No murmur heard. Pulmonary/Chest: Effort normal and breath sounds normal. No respiratory distress. He has no wheezes. He has no rales.  Abdominal: Soft. He exhibits no distension. There is no tenderness.  Musculoskeletal:  Left shoulder with no tenderness to palpation. Decreased active ROM, full passive ROM. No crepitus, erythema, deformity, or swelling appreciated. Negative lift-off.   Neurological: He is alert and oriented to person, place, and time.  Skin: Skin is warm and dry.  Nursing note and vitals reviewed.   ED Course  Procedures (including critical care time) Labs Review Labs Reviewed - No data to display  Imaging Review Dg Shoulder Left  01/22/2016  CLINICAL DATA:  Left shoulder  pain and tenderness after fall several months ago. EXAM: LEFT SHOULDER - 2+ VIEW COMPARISON:  None. FINDINGS: Mild degenerative changes in the left shoulder, most pronounced in the left [facility] joint. No acute bony abnormality. Specifically, no fracture, subluxation, or dislocation. Soft tissues are intact. IMPRESSION: No acute bony abnormality. Electronically Signed   By: Charlett Nose M.D.   On: 01/22/2016 12:52   I have personally reviewed and evaluated these images and lab results as part  of my medical decision-making.   EKG Interpretation None      MDM   Final diagnoses:  Left shoulder pain  Elevated blood pressure   [name] presents to ED for persistent left shoulder pain x 6 months after an injury. X-ray obtained which was unremarkable. [facility] home care discussed. Naproxen for pain. Ortho follow up.   BP elevated in ED today - follow up with PCP in 1 week for BP recheck.   [facility] Keagen Heinlen, PA-C 01/22/16 1351  Linwood Dibbles, MD 01/22/16 [phone]

## 2016-01-22 NOTE — Discharge Instructions (Signed)
Call the orthopedic physician listed today or tomorrow to schedule a follow up appointment.  Your blood pressure was elevated today - Follow up with your primary care provider in 1 week for a blood pressure check. Naproxen as needed for pain.

## 2016-01-22 NOTE — ED Notes (Signed)
Patient BACK from  X-ray

## 2016-09-06 NOTE — Telephone Encounter (Signed)
ordered

## 2016-09-06 NOTE — Telephone Encounter (Signed)
Please add physical lab orders

## 2016-09-27 ENCOUNTER — Encounter: Attending: Geriatric Medicine | Primary: Geriatric Medicine

## 2018-08-19 ENCOUNTER — Encounter (HOSPITAL_BASED_OUTPATIENT_CLINIC_OR_DEPARTMENT_OTHER): Payer: Self-pay

## 2018-08-19 ENCOUNTER — Emergency Department (HOSPITAL_BASED_OUTPATIENT_CLINIC_OR_DEPARTMENT_OTHER)
Admission: EM | Admit: 2018-08-19 | Discharge: 2018-08-19 | Disposition: A | Discharge status: Home / Self Care | Payer: BLUE CROSS/BLUE SHIELD | Attending: Emergency Medicine | Admitting: Emergency Medicine

## 2018-08-19 ENCOUNTER — Other Ambulatory Visit: Payer: Self-pay

## 2018-08-19 ENCOUNTER — Emergency Department (HOSPITAL_BASED_OUTPATIENT_CLINIC_OR_DEPARTMENT_OTHER): Payer: BLUE CROSS/BLUE SHIELD

## 2018-08-19 DIAGNOSIS — Z9884 Bariatric surgery status: Secondary | ICD-10-CM | POA: Diagnosis not present

## 2018-08-19 DIAGNOSIS — I2510 Atherosclerotic heart disease of native coronary artery without angina pectoris: Secondary | ICD-10-CM | POA: Diagnosis not present

## 2018-08-19 DIAGNOSIS — R51 Headache: Secondary | ICD-10-CM | POA: Diagnosis not present

## 2018-08-19 DIAGNOSIS — M542 Cervicalgia: Secondary | ICD-10-CM | POA: Diagnosis not present

## 2018-08-19 DIAGNOSIS — Z9049 Acquired absence of other specified parts of digestive tract: Secondary | ICD-10-CM | POA: Insufficient documentation

## 2018-08-19 DIAGNOSIS — R519 Headache, unspecified: Secondary | ICD-10-CM

## 2018-08-19 DIAGNOSIS — Z951 Presence of aortocoronary bypass graft: Secondary | ICD-10-CM | POA: Diagnosis not present

## 2018-08-19 MED ORDER — CYCLOBENZAPRINE HCL 10 MG PO TABS: 10.0000 mg | ORAL_TABLET | Freq: Two times a day (BID) | ORAL | 0 refills | Status: DC | PRN

## 2018-08-19 NOTE — ED Provider Notes (Signed)
[facility] EMERGENCY DEPARTMENT Provider Note   CSN: [id number] Arrival date & time: 08/19/18  1145     History   Chief Complaint Chief Complaint  Patient presents with  . Neck Pain    HPI [name] is a 48 y.o. male.  HPI   Upper neck pain, headache pain, 3-4 days. Pain about 5/10 right now. Last night went to bed at 8--9AM, this morning felt like head was very heavy. Not sure if pulled muscle or if pinched nerve.  Looking down a lot, work as a Investment banker, operational.  Dizziness started yesterday, it comes and goes, normally happens when looking down then look up.  Turning head from side to side has pain in neck, feels like a lot of pain. Some nights wakes up in the middle of the night with headache or neck pain.   2 weeks ago numbness in right middle and ring finger at tip of finger. No arm weakness.  No trouble walkig, talking, drooping of face, change in vision. Woke up and thought slept wrong but continued to get worse and then dizziness started. No trauma or sudden start.     Past Medical History:  Diagnosis Date  . Coronary artery disease     Patient Active Problem List   Diagnosis Date Noted  . BMI 37.0-37.9, adult 08/21/2013  . Elevated BP 08/21/2013    Past Surgical History:  Procedure Laterality Date  . CHOLECYSTECTOMY    . CORONARY ARTERY BYPASS GRAFT    . GASTRIC BYPASS          Home Medications    Prior to Admission medications   Medication Sig Start Date End Date Taking? Authorizing Provider  clindamycin (CLEOCIN) 300 MG capsule Take 1 capsule (300 mg total) by mouth 3 (three) times daily. 10/11/14   Derwood Kaplan, MD  cyclobenzaprine (FLEXERIL) 10 MG tablet Take 1 tablet (10 mg total) by mouth 2 (two) times daily as needed for muscle spasms. 08/19/18   Alvira Monday, MD  naproxen (NAPROSYN) 500 MG tablet Take 1 tablet (500 mg total) by mouth 2 (two) times daily as needed. 01/22/16   Ward, Chase Picket, PA-C    Family History Family History   Problem Relation Age of Onset  . Cancer Mother   . Healthy Father   . Diabetes Sister   . Healthy Brother   . Multiple sclerosis Sister     Social History Social History   Tobacco Use  . Smoking status: Never Smoker  . Smokeless tobacco: Never Used  Substance Use Topics  . Alcohol use: Yes    Comment: daily  . Drug use: Never     Allergies   Patient has no known allergies.   Review of Systems Review of Systems  Constitutional: Negative for fever.  Gastrointestinal: Negative for abdominal pain, nausea and vomiting.  Genitourinary: Negative for dysuria.  Musculoskeletal: Positive for neck pain.  Neurological: Positive for headaches (5/10 pain). Negative for dizziness, facial asymmetry, speech difficulty, weakness and numbness.     Physical Exam Updated Vital Signs BP (!) 175/110 (BP Location: Left Arm) Comment: pt does not take meds  Pulse 73   Temp 98.4 F (36.9 C) (Oral)   Resp 14   Ht 6\' 1"  (1.854 m)   Wt (!) 137.5 kg   SpO2 100%   BMI 39.99 kg/m   Physical Exam Vitals signs and nursing note reviewed.  Constitutional:      General: He is not in acute distress.  Appearance: He is well-developed. He is not diaphoretic.  HENT:     Head: Normocephalic and atraumatic.  Eyes:     General: No visual field deficit.    Conjunctiva/sclera: Conjunctivae normal.  Neck:     Musculoskeletal: Normal range of motion.  Cardiovascular:     Rate and Rhythm: Normal rate and regular rhythm.     Heart sounds: Normal heart sounds. No murmur. No friction rub. No gallop.   Pulmonary:     Effort: Pulmonary effort is normal. No respiratory distress.     Breath sounds: Normal breath sounds. No wheezing or rales.  Abdominal:     General: There is no distension.     Palpations: Abdomen is soft.     Tenderness: There is no abdominal tenderness. There is no guarding.  Skin:    General: Skin is warm and dry.  Neurological:     Mental Status: He is alert and oriented to  person, place, and time.     GCS: GCS eye subscore is 4. GCS verbal subscore is 5. GCS motor subscore is 6.     Cranial Nerves: Cranial nerves are intact. No dysarthria or facial asymmetry.     Sensory: Sensation is intact. No sensory deficit.     Motor: Motor function is intact.     Coordination: Coordination is intact. Coordination normal.      ED Treatments / Results  Labs (all labs ordered are listed, but only abnormal results are displayed) Labs Reviewed - No data to display  EKG None  Radiology Ct Head Wo Contrast  Result Date: 08/19/2018 CLINICAL DATA:  48 year old male with history of occipital head pain causing dizziness for the past 3-4 days. EXAM: CT HEAD WITHOUT CONTRAST TECHNIQUE: Contiguous axial images were obtained from the base of the skull through the vertex without intravenous contrast. COMPARISON:  No priors. FINDINGS: Brain: No evidence of acute infarction, hemorrhage, hydrocephalus, extra-axial collection or mass lesion/mass effect. Vascular: No hyperdense vessel or unexpected calcification. Skull: Normal. Negative for fracture or focal lesion. Sinuses/Orbits: No acute finding. Other: None. IMPRESSION: 1. No acute intracranial abnormalities. The appearance of the brain is normal. Electronically Signed   By: Trudie Reedaniel  Entrikin M.D.   On: 08/19/2018 13:39    Procedures Procedures (including critical care time)  Medications Ordered in ED Medications - No data to display   Initial Impression / Assessment and Plan / ED Course  I have reviewed the triage vital signs and the nursing notes.  Pertinent labs & imaging results that were available during my care of the patient were reviewed by me and considered in my medical decision making (see chart for details).     48 year old male presents with concern for headache/neck pain and intermittent dizziness with looking up.  Given occipital headache that is waking patient from sleep, and dizziness, ordered screening head  CT which shows no acute abnormalities.  He does not have any history of trauma, low suspicion for cervical spine fracture.  He has a normal neurologic exam, history is not consistent with CVA, vertebral dissection and I have low suspicion overall for vertebrobasilar insufficiency.  Suspect most likely etiology of his pain is musculoskeletal, cervical strain related to work and vertigo from BPPV. Does state he has a history of BPPV. No current dizziness, no neurologic deficits on exam.   Given rx for flexeril. Recommend close PCP follow up particularly if symptoms continue. Patient discharged in stable condition with understanding of reasons to return.   Final Clinical Impressions(s) /  ED Diagnoses   Final diagnoses:  Neck pain  Occipital headache    ED Discharge Orders         Ordered    cyclobenzaprine (FLEXERIL) 10 MG tablet  2 times daily PRN     08/19/18 1410           Alvira Monday, MD 08/20/18 [phone]

## 2018-08-19 NOTE — ED Triage Notes (Signed)
C/o pain to posterior head/neck x 4 days-denies injury-NAD-steady gait

## 2018-10-22 ENCOUNTER — Other Ambulatory Visit: Payer: Self-pay

## 2018-10-22 ENCOUNTER — Encounter (HOSPITAL_COMMUNITY): Payer: Self-pay | Admitting: *Deleted

## 2018-10-22 ENCOUNTER — Emergency Department (HOSPITAL_COMMUNITY)
Admission: EM | Admit: 2018-10-22 | Discharge: 2018-10-22 | Disposition: A | Discharge status: Home / Self Care | Payer: BLUE CROSS/BLUE SHIELD | Attending: Emergency Medicine | Admitting: Emergency Medicine

## 2018-10-22 DIAGNOSIS — I259 Chronic ischemic heart disease, unspecified: Secondary | ICD-10-CM | POA: Diagnosis not present

## 2018-10-22 DIAGNOSIS — Z79899 Other long term (current) drug therapy: Secondary | ICD-10-CM | POA: Insufficient documentation

## 2018-10-22 DIAGNOSIS — N476 Balanoposthitis: Secondary | ICD-10-CM | POA: Diagnosis not present

## 2018-10-22 DIAGNOSIS — N489 Disorder of penis, unspecified: Secondary | ICD-10-CM | POA: Diagnosis not present

## 2018-10-22 LAB — CBG MONITORING, ED: Glucose-Capillary: 96 mg/dL (ref 70–99)

## 2018-10-22 MED ORDER — CLOTRIMAZOLE-BETAMETHASONE 1-0.05 % EX CREA: TOPICAL_CREAM | CUTANEOUS | 0 refills | Status: DC

## 2018-10-22 NOTE — ED Triage Notes (Signed)
Pt states he noticed small lines around his penis shaft about 8 days ago. Pt states his penis shaft felt irritated. Pt's penis then started swelling 5 days ago. Pt denies urinary symptoms. Pt noticed some tenderness in his testicles this morning. Pt also noticed his left inguinal lymph nodes were swollen. Pt denies fever, chills.

## 2018-10-26 ENCOUNTER — Ambulatory Visit
Admission: EM | Admit: 2018-10-26 | Discharge: 2018-10-26 | Disposition: A | Discharge status: Home / Self Care | Payer: BLUE CROSS/BLUE SHIELD | Attending: Family Medicine | Admitting: Family Medicine

## 2018-10-26 ENCOUNTER — Encounter: Payer: Self-pay | Admitting: Emergency Medicine

## 2018-10-26 DIAGNOSIS — Z202 Contact with and (suspected) exposure to infections with a predominantly sexual mode of transmission: Secondary | ICD-10-CM

## 2018-10-26 DIAGNOSIS — R369 Urethral discharge, unspecified: Secondary | ICD-10-CM

## 2018-10-26 DIAGNOSIS — R309 Painful micturition, unspecified: Secondary | ICD-10-CM | POA: Diagnosis not present

## 2018-10-26 MED ORDER — LIDOCAINE VISCOUS HCL 2 % MT SOLN: OROMUCOSAL | 0 refills | Status: DC

## 2018-10-26 NOTE — Discharge Instructions (Signed)
We did lab testing during this visit.  If there are any abnormal findings that require change in medicine or indicate a positive result, you will be notified.  If all of your tests are normal, you will not be called.   ° °

## 2018-10-26 NOTE — ED Notes (Signed)
Patient able to ambulate independently  

## 2018-10-26 NOTE — ED Triage Notes (Signed)
Pt presents to [facility] for assessment of swelling and lesions to his foreskin starting last Monday (1 week ago).  Was seen at [facility] last week and diagnosed with Balantitis.  States it is improving, but wants to be sure now STIs were missed.

## 2018-10-26 NOTE — ED Provider Notes (Signed)
[facility]    CSN: [id number] Arrival date & time: 10/26/18  1741     History   Chief Complaint Chief Complaint  Patient presents with  . Penile Discharge    HPI [name] is a 48 y.o. male.   HPI  Patient is here for STD evaluation.  He was seen at the emergency room on 10/22/2018 for  balanitis.  He was given trazodone cream.  It is helping a lot.  His biggest problem is pain with urination. He has concerns that he might have an STD.  He has no symptoms except for the balanitis.  He has had unprotected sexual relations. I discussed that we can test for gonorrhea, chlamydia, trichomonas, syphilis and HIV.  He specifically asked about being tested for herpes.  I told him that if he has any lesions that look like they could be herpes I will be happy to swab this for culture.  Otherwise there is no other test available.  He states he has some swelling and fissures around his penis, but no circular lesions no ulcers.  Past Medical History:  Diagnosis Date  . Coronary artery disease     Patient Active Problem List   Diagnosis Date Noted  . BMI 37.0-37.9, adult 08/21/2013  . Elevated BP 08/21/2013    Past Surgical History:  Procedure Laterality Date  . CARDIAC CATHETERIZATION    . CHOLECYSTECTOMY    . GASTRIC BYPASS         Home Medications    Prior to Admission medications   Medication Sig Start Date End Date Taking? Authorizing Provider  clotrimazole-betamethasone (LOTRISONE) cream Apply to affected area 2 times daily prn 10/22/18   Raeford Razor, MD  cyclobenzaprine (FLEXERIL) 10 MG tablet Take 1 tablet (10 mg total) by mouth 2 (two) times daily as needed for muscle spasms. 08/19/18   Alvira Monday, MD  lidocaine (XYLOCAINE) 2 % solution Apply to painful area prior to cleaning or using bathroom 10/26/18   Eustace Moore, MD    Family History Family History  Problem Relation Age of Onset  . Cancer Mother   . Healthy Father   . Diabetes  Sister   . Healthy Brother   . Multiple sclerosis Sister     Social History Social History   Tobacco Use  . Smoking status: Never Smoker  . Smokeless tobacco: Never Used  Substance Use Topics  . Alcohol use: Yes    Alcohol/week: 2.0 - 3.0 standard drinks    Types: 2 - 3 Glasses of wine per week    Comment: daily  . Drug use: Never     Allergies   Patient has no known allergies.   Review of Systems Review of Systems  Constitutional: Negative for chills and fever.  HENT: Negative for ear pain and sore throat.   Eyes: Negative for pain and visual disturbance.  Respiratory: Negative for cough and shortness of breath.   Cardiovascular: Negative for chest pain and palpitations.  Gastrointestinal: Negative for abdominal pain and vomiting.  Genitourinary: Positive for penile swelling. Negative for dysuria and hematuria.  Musculoskeletal: Negative for arthralgias and back pain.  Skin: Negative for color change and rash.  Neurological: Negative for seizures and syncope.  All other systems reviewed and are negative.    Physical Exam Triage Vital Signs ED Triage Vitals  Enc Vitals Group     BP 10/26/18 1750 (!) 190/141     Pulse Rate 10/26/18 1750 89  Resp 10/26/18 1750 18     Temp 10/26/18 1750 97.8 F (36.6 C)     Temp Source 10/26/18 1750 Oral     SpO2 10/26/18 1750 98 %   No data found.  Updated Vital Signs BP (!) 190/141 (BP Location: Left Arm)   Pulse 89   Temp 97.8 F (36.6 C) (Oral)   Resp 18   SpO2 98%        Physical Exam Constitutional:      General: He is not in acute distress.    Appearance: He is well-developed.  HENT:     Head: Normocephalic and atraumatic.  Eyes:     Conjunctiva/sclera: Conjunctivae normal.     Pupils: Pupils are equal, round, and reactive to light.  Neck:     Musculoskeletal: Normal range of motion.  Cardiovascular:     Rate and Rhythm: Normal rate.  Pulmonary:     Effort: Pulmonary effort is normal. No respiratory  distress.  Abdominal:     General: There is no distension.     Palpations: Abdomen is soft.  Genitourinary:    Comments: Declines examination Musculoskeletal: Normal range of motion.  Skin:    General: Skin is warm and dry.  Neurological:     Mental Status: He is alert.      UC Treatments / Results  Labs (all labs ordered are listed, but only abnormal results are displayed) Labs Reviewed  HIV ANTIBODY (ROUTINE TESTING W REFLEX)  RPR  URINE CYTOLOGY ANCILLARY ONLY    EKG None  Radiology No results found.  Procedures Procedures (including critical care time)  Medications Ordered in UC Medications - No data to display  Initial Impression / Assessment and Plan / UC Course  I have reviewed the triage vital signs and the nursing notes.  Pertinent labs & imaging results that were available during my care of the patient were reviewed by me and considered in my medical decision making (see chart for details).     The patient states that urination is very painful.  I am offering him a prescription for lidocaine viscus to apply to the rash area prior to cleaning and urinating.  Am hoping this will be helpful for him. Final Clinical Impressions(s) / UC Diagnoses   Final diagnoses:  Possible exposure to STD     Discharge Instructions     We did lab testing during this visit.  If there are any abnormal findings that require change in medicine or indicate a positive result, you will be notified.  If all of your tests are normal, you will not be called.      ED Prescriptions    Medication Sig Dispense Auth. Provider   lidocaine (XYLOCAINE) 2 % solution Apply to painful area prior to cleaning or using bathroom 60 mL Delton See Letta Pate, MD     Controlled Substance Prescriptions [place] Controlled Substance Registry consulted? Not Applicable   Eustace Moore, MD 10/26/18 Izola Price

## 2018-10-28 LAB — RPR: RPR Ser Ql: NONREACTIVE

## 2018-10-28 LAB — URINE CYTOLOGY ANCILLARY ONLY
Chlamydia: NEGATIVE
NEISSERIA GONORRHEA: NEGATIVE
TRICH (WINDOWPATH): NEGATIVE

## 2018-10-28 LAB — HIV ANTIBODY (ROUTINE TESTING W REFLEX): HIV SCREEN 4TH GENERATION: NONREACTIVE

## 2018-10-28 NOTE — ED Provider Notes (Signed)
[facility]-EMERGENCY DEPT Provider Note   CSN: [id number] Arrival date & time: 10/22/18  0730    History   Chief Complaint Chief Complaint  Patient presents with  . Groin Swelling    HPI [name] is a 48 y.o. male.     HPI   48 year old male with swelling and pain to the tip of his penis.  Onset about a week ago.  States it feels irritated.  Some mild discomfort when he initially urinates which then improves.  No discharge.  No abdominal pain.  No testicular pain or scrotal swelling.  Noticed some small painful lumps in his left groin.  No fevers.  Past Medical History:  Diagnosis Date  . Coronary artery disease     Patient Active Problem List   Diagnosis Date Noted  . BMI 37.0-37.9, adult 08/21/2013  . Elevated BP 08/21/2013    Past Surgical History:  Procedure Laterality Date  . CARDIAC CATHETERIZATION    . CHOLECYSTECTOMY    . GASTRIC BYPASS          Home Medications    Prior to Admission medications   Medication Sig Start Date End Date Taking? Authorizing Provider  clotrimazole-betamethasone (LOTRISONE) cream Apply to affected area 2 times daily prn 10/22/18   Raeford Razor, MD  cyclobenzaprine (FLEXERIL) 10 MG tablet Take 1 tablet (10 mg total) by mouth 2 (two) times daily as needed for muscle spasms. 08/19/18   Alvira Monday, MD  lidocaine (XYLOCAINE) 2 % solution Apply to painful area prior to cleaning or using bathroom 10/26/18   Eustace Moore, MD    Family History Family History  Problem Relation Age of Onset  . Cancer Mother   . Healthy Father   . Diabetes Sister   . Healthy Brother   . Multiple sclerosis Sister     Social History Social History   Tobacco Use  . Smoking status: Never Smoker  . Smokeless tobacco: Never Used  Substance Use Topics  . Alcohol use: Yes    Alcohol/week: 2.0 - 3.0 standard drinks    Types: 2 - 3 Glasses of wine per week    Comment: daily  . Drug use: Never     Allergies    Patient has no known allergies.   Review of Systems Review of Systems  All systems reviewed and negative, other than as noted in HPI.  Physical Exam Updated Vital Signs BP (!) 170/112 (BP Location: Right Arm)   Pulse 69   Temp 98.8 F (37.1 C) (Oral)   Resp 18   SpO2 95%   Physical Exam Vitals signs and nursing note reviewed.  Constitutional:      General: He is not in acute distress.    Appearance: He is well-developed.  HENT:     Head: Normocephalic and atraumatic.  Eyes:     General:        Right eye: No discharge.        Left eye: No discharge.     Conjunctiva/sclera: Conjunctivae normal.  Neck:     Musculoskeletal: Neck supple.  Cardiovascular:     Rate and Rhythm: Normal rate and regular rhythm.     Heart sounds: Normal heart sounds. No murmur. No friction rub. No gallop.   Pulmonary:     Effort: Pulmonary effort is normal. No respiratory distress.     Breath sounds: Normal breath sounds.  Abdominal:     General: There is no distension.     Palpations:  Abdomen is soft.     Tenderness: There is no abdominal tenderness.  Genitourinary:    Comments: Uncircumcised.  Glans with mildly irritated appearance.  Adjacent foreskin with erythema and some some areas which look somewhat macerated.  Filmy white exudate.  Penis and scrotum/testicle exam otherwise unremarkable.  Some shotty left inguinal adenopathy. Musculoskeletal:        General: No tenderness.  Skin:    General: Skin is warm and dry.  Neurological:     Mental Status: He is alert.  Psychiatric:        Behavior: Behavior normal.        Thought Content: Thought content normal.      ED Treatments / Results  Labs (all labs ordered are listed, but only abnormal results are displayed) Labs Reviewed  CBG MONITORING, ED    EKG None  Radiology No results found.  Procedures Procedures (including critical care time)  Medications Ordered in ED Medications - No data to display   Initial  Impression / Assessment and Plan / ED Course  I have reviewed the triage vital signs and the nursing notes.  Pertinent labs & imaging results that were available during my care of the patient were reviewed by me and considered in my medical decision making (see chart for details).        Exam consistent with balanitis.  Glucose is fine.  Placed on Lotrisone cream.  Hygiene discussed.  Return precautions discussed.  Urology follow-up for persistent symptoms despite appropriate treatment.  Final Clinical Impressions(s) / ED Diagnoses   Final diagnoses:  Balanoposthitis    ED Discharge Orders         Ordered    clotrimazole-betamethasone (LOTRISONE) cream     10/22/18 7408           Raeford Razor, MD 10/28/18 1553

## 2018-12-01 DIAGNOSIS — L900 Lichen sclerosus et atrophicus: Secondary | ICD-10-CM | POA: Diagnosis not present

## 2018-12-01 DIAGNOSIS — Z125 Encounter for screening for malignant neoplasm of prostate: Secondary | ICD-10-CM | POA: Diagnosis not present

## 2019-01-12 DIAGNOSIS — N481 Balanitis: Secondary | ICD-10-CM | POA: Diagnosis not present

## 2019-05-22 DIAGNOSIS — I10 Essential (primary) hypertension: Secondary | ICD-10-CM | POA: Diagnosis not present

## 2019-06-03 DIAGNOSIS — Z03818 Encounter for observation for suspected exposure to other biological agents ruled out: Secondary | ICD-10-CM | POA: Diagnosis not present

## 2019-06-12 DIAGNOSIS — I10 Essential (primary) hypertension: Secondary | ICD-10-CM | POA: Diagnosis not present

## 2019-06-12 DIAGNOSIS — Z6838 Body mass index (BMI) 38.0-38.9, adult: Secondary | ICD-10-CM | POA: Diagnosis not present

## 2019-06-12 DIAGNOSIS — E871 Hypo-osmolality and hyponatremia: Secondary | ICD-10-CM | POA: Diagnosis not present

## 2019-06-26 DIAGNOSIS — Z20828 Contact with and (suspected) exposure to other viral communicable diseases: Secondary | ICD-10-CM | POA: Diagnosis not present

## 2019-06-26 DIAGNOSIS — Z6839 Body mass index (BMI) 39.0-39.9, adult: Secondary | ICD-10-CM | POA: Diagnosis not present

## 2019-06-26 DIAGNOSIS — I10 Essential (primary) hypertension: Secondary | ICD-10-CM | POA: Diagnosis not present

## 2019-07-22 DIAGNOSIS — I10 Essential (primary) hypertension: Secondary | ICD-10-CM | POA: Diagnosis not present

## 2019-07-22 DIAGNOSIS — Z6839 Body mass index (BMI) 39.0-39.9, adult: Secondary | ICD-10-CM | POA: Diagnosis not present

## 2019-07-26 ENCOUNTER — Telehealth: Payer: Self-pay

## 2019-07-26 NOTE — Telephone Encounter (Signed)
Called patient to do their pre-visit COVID screening.  Call went to voicemail. Unable to do prescreening.  

## 2019-07-27 ENCOUNTER — Ambulatory Visit: Payer: BLUE CROSS/BLUE SHIELD

## 2019-08-06 ENCOUNTER — Ambulatory Visit: Payer: BC Managed Care – PPO | Attending: Internal Medicine

## 2019-08-06 DIAGNOSIS — Z20828 Contact with and (suspected) exposure to other viral communicable diseases: Secondary | ICD-10-CM | POA: Diagnosis not present

## 2019-08-06 DIAGNOSIS — Z20822 Contact with and (suspected) exposure to covid-19: Secondary | ICD-10-CM

## 2019-08-07 LAB — NOVEL CORONAVIRUS, NAA: SARS-CoV-2, NAA: NOT DETECTED

## 2019-08-23 ENCOUNTER — Ambulatory Visit: Payer: BC Managed Care – PPO | Attending: Internal Medicine

## 2019-08-23 DIAGNOSIS — Z20822 Contact with and (suspected) exposure to covid-19: Secondary | ICD-10-CM | POA: Diagnosis not present

## 2019-08-24 LAB — NOVEL CORONAVIRUS, NAA: SARS-CoV-2, NAA: NOT DETECTED

## 2019-09-13 ENCOUNTER — Encounter: Payer: Self-pay | Admitting: Family Medicine

## 2019-09-13 ENCOUNTER — Other Ambulatory Visit: Payer: Self-pay

## 2019-09-13 ENCOUNTER — Ambulatory Visit: Payer: BC Managed Care – PPO | Attending: Internal Medicine

## 2019-09-13 ENCOUNTER — Ambulatory Visit (INDEPENDENT_AMBULATORY_CARE_PROVIDER_SITE_OTHER): Payer: BC Managed Care – PPO | Admitting: Family Medicine

## 2019-09-13 VITALS — BP 166/120 | HR 89 | Temp 96.0°F | Ht 73.0 in | Wt 294.2 lb

## 2019-09-13 DIAGNOSIS — I10 Essential (primary) hypertension: Secondary | ICD-10-CM

## 2019-09-13 DIAGNOSIS — Z9884 Bariatric surgery status: Secondary | ICD-10-CM | POA: Diagnosis not present

## 2019-09-13 DIAGNOSIS — E559 Vitamin D deficiency, unspecified: Secondary | ICD-10-CM | POA: Diagnosis not present

## 2019-09-13 DIAGNOSIS — Z0000 Encounter for general adult medical examination without abnormal findings: Secondary | ICD-10-CM | POA: Diagnosis not present

## 2019-09-13 DIAGNOSIS — Z20822 Contact with and (suspected) exposure to covid-19: Secondary | ICD-10-CM

## 2019-09-13 DIAGNOSIS — E538 Deficiency of other specified B group vitamins: Secondary | ICD-10-CM

## 2019-09-13 LAB — LIPID PANEL
Cholesterol: 131 mg/dL (ref 0–200)
HDL: 42.9 mg/dL (ref >39.00)
LDL Cholesterol: 67 mg/dL (ref 0–99)
NonHDL: 88.25
Total CHOL/HDL Ratio: 3
Triglycerides: 108 mg/dL (ref 0.0–149.0)
VLDL: 21.6 mg/dL (ref 0.0–40.0)

## 2019-09-13 LAB — COMPREHENSIVE METABOLIC PANEL
ALT: 10 U/L (ref 0–53)
AST: 16 U/L (ref 0–37)
Albumin: 4 g/dL (ref 3.5–5.2)
Alkaline Phosphatase: 61 U/L (ref 39–117)
BUN: 9 mg/dL (ref 6–23)
CO2: 27 mEq/L (ref 19–32)
Calcium: 8.6 mg/dL (ref 8.4–10.5)
Chloride: 102 mEq/L (ref 96–112)
Creatinine, Ser: 0.89 mg/dL (ref 0.40–1.50)
GFR: 110.06 mL/min (ref >60.00)
Glucose, Bld: 99 mg/dL (ref 70–99)
Potassium: 4.4 mEq/L (ref 3.5–5.1)
Sodium: 135 mEq/L (ref 135–145)
Total Bilirubin: 0.7 mg/dL (ref 0.2–1.2)
Total Protein: 7 g/dL (ref 6.0–8.3)

## 2019-09-13 LAB — URINALYSIS, ROUTINE W REFLEX MICROSCOPIC
Bilirubin Urine: NEGATIVE
Hgb urine dipstick: NEGATIVE
Ketones, ur: NEGATIVE
Leukocytes,Ua: NEGATIVE
Nitrite: NEGATIVE
RBC / HPF: NONE SEEN (ref 0–?)
Specific Gravity, Urine: 1.025 (ref 1.000–1.030)
Total Protein, Urine: NEGATIVE
Urine Glucose: NEGATIVE
Urobilinogen, UA: 1 (ref 0.0–1.0)
pH: 6 (ref 5.0–8.0)

## 2019-09-13 LAB — CBC
HCT: 36.6 % — ABNORMAL LOW (ref 39.0–52.0)
Hemoglobin: 11.8 g/dL — ABNORMAL LOW (ref 13.0–17.0)
MCHC: 32.3 g/dL (ref 30.0–36.0)
MCV: 92.6 fl (ref 78.0–100.0)
Platelets: 247 10*3/uL (ref 150.0–400.0)
RBC: 3.95 Mil/uL — ABNORMAL LOW (ref 4.22–5.81)
RDW: 15.4 % (ref 11.5–15.5)
WBC: 4.2 10*3/uL (ref 4.0–10.5)

## 2019-09-13 LAB — VITAMIN D 25 HYDROXY (VIT D DEFICIENCY, FRACTURES): VITD: <7 ng/mL — ABNORMAL LOW (ref 30.00–100.00)

## 2019-09-13 LAB — MICROALBUMIN / CREATININE URINE RATIO
Creatinine,U: 166.6 mg/dL
Microalb Creat Ratio: 1.7 mg/g (ref 0.0–30.0)
Microalb, Ur: 2.9 mg/dL — ABNORMAL HIGH (ref 0.0–1.9)

## 2019-09-13 LAB — B12 AND FOLATE PANEL
Folate: 6.1 ng/mL (ref >5.9)
Vitamin B-12: 94 pg/mL — ABNORMAL LOW (ref 211–911)

## 2019-09-13 LAB — PSA: PSA: 0.17 ng/mL (ref 0.10–4.00)

## 2019-09-13 LAB — TSH: TSH: 1.99 u[IU]/mL (ref 0.35–4.50)

## 2019-09-13 LAB — LDL CHOLESTEROL, DIRECT: Direct LDL: 60 mg/dL

## 2019-09-13 MED ORDER — CHLORTHALIDONE 25 MG PO TABS: 25.0000 mg | ORAL_TABLET | Freq: Every day | ORAL | 2 refills | Status: DC

## 2019-09-13 MED ORDER — CHLORTHALIDONE 25 MG PO TABS: 25.0000 mg | ORAL_TABLET | Freq: Every day | ORAL | 0 refills | Status: DC

## 2019-09-13 NOTE — Progress Notes (Addendum)
New Patient Office Visit  Subjective:  Patient ID: [name], male    DOB: 1971/03/08  Age: 50 y.o. MRN: [id number]  CC:  Chief Complaint  Patient presents with  . Establish Care    New pt would like to discuss Hypertension.     HPI [name] presents for evaluation and possible treatment of his elevated blood pressure.  It has been elevated over the last 6 months since he has been checking it.  Running about where it is today.  He was started on amlodipine back in October but his blood pressure did not seem to respond to it.  He has since been off of any medications.  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  He is aware of the fact that sodium has on blood pressure.  Drinks 2 to 3 glasses of wine weekly.  He does not use illicit drugs.  Drinks 1 cup of coffee daily.  He is not exercising regularly.  Has not seen the dentist recently.  Past Medical History:  Diagnosis Date  . Coronary artery disease   . Hypertension     Past Surgical History:  Procedure Laterality Date  . CARDIAC CATHETERIZATION    . CHOLECYSTECTOMY    . GASTRIC BYPASS      Family History  Problem Relation Age of Onset  . Cancer Mother   . Healthy Father   . Diabetes Sister   . Healthy Brother   . Cancer Brother   . Multiple sclerosis Sister     Social History   Socioeconomic History  . Marital status: Single    Spouse name: Not on file  . Number of children: Not on file  . Years of education: Not on file  . Highest education level: Not on file  Occupational History  . Not on file  Tobacco Use  . Smoking status: Never Smoker  . Smokeless tobacco: Never Used  Substance and Sexual Activity  . Alcohol use: Yes    Alcohol/week: 2.0 - 3.0 standard drinks    Types: 2 - 3 Glasses of wine per week    Comment: daily  . Drug use: Never  . Sexual activity: Yes  Other Topics Concern  . Not on file  Social History Narrative  . Not on file    Social Determinants of Health   Financial Resource Strain:   . Difficulty of Paying Living Expenses: Not on file  Food Insecurity:   . Worried About Charity fundraiser in the Last Year: Not on file  . Ran Out of Food in the Last Year: Not on file  Transportation Needs:   . Lack of Transportation (Medical): Not on file  . Lack of Transportation (Non-Medical): Not on file  Physical Activity:   . Days of Exercise per Week: Not on file  . Minutes of Exercise per Session: Not on file  Stress:   . Feeling of Stress : Not on file  Social Connections:   . Frequency of Communication with Friends and Family: Not on file  . Frequency of Social Gatherings with Friends and Family: Not on file  . Attends Religious Services: Not on file  . Active Member of Clubs or Organizations: Not on file  . Attends Archivist Meetings: Not on file  . Marital Status: Not on file  Intimate Partner Violence:   . Fear of Current or Ex-Partner: Not on file  . Emotionally Abused:  Not on file  . Physically Abused: Not on file  . Sexually Abused: Not on file    ROS Review of Systems  Constitutional: Negative for chills, diaphoresis, fatigue, fever and unexpected weight change.  HENT: Negative.   Eyes: Negative for photophobia and visual disturbance.  Respiratory: Negative.   Cardiovascular: Negative.   Gastrointestinal: Negative.   Genitourinary: Negative.   Musculoskeletal: Negative for gait problem and joint swelling.  Neurological: Negative for tremors and speech difficulty.  Hematological: Does not bruise/bleed easily.  Psychiatric/Behavioral: Negative.    Depression screen [facility] 2/9 09/13/2019 09/13/2019  Decreased Interest 0 0  Down, Depressed, Hopeless 0 0  PHQ - 2 Score 0 0  Altered sleeping 0 -  Tired, decreased energy 0 -  Change in appetite 0 -  Feeling bad or failure about yourself  0 -  Trouble concentrating 0 -  Moving slowly or fidgety/restless 0 -  Suicidal thoughts 0 -   PHQ-9 Score 0 -    Objective:   Today's Vitals: BP (!) 166/120   Pulse 89   Temp (!) 96 F (35.6 C) (Tympanic)   Ht 6\' 1"  (1.854 m)   Wt 294 lb 3.2 oz (133.4 kg)   SpO2 97%   BMI 38.82 kg/m   Physical Exam Constitutional:      General: He is not in acute distress.    Appearance: Normal appearance. He is obese. He is not ill-appearing, toxic-appearing or diaphoretic.  HENT:     Head: Normocephalic and atraumatic.     Right Ear: Tympanic membrane, ear canal and external ear normal.     Left Ear: Tympanic membrane, ear canal and external ear normal.     Nose: Nose normal. No congestion or rhinorrhea.  Eyes:     General: No scleral icterus.       Right eye: No discharge.        Left eye: No discharge.     Extraocular Movements: Extraocular movements intact.     Conjunctiva/sclera: Conjunctivae normal.     Pupils: Pupils are equal, round, and reactive to light.  Neck:     Vascular: No carotid bruit.  Cardiovascular:     Rate and Rhythm: Normal rate and regular rhythm.  Pulmonary:     Effort: Pulmonary effort is normal.     Breath sounds: Normal breath sounds.  Abdominal:     General: Bowel sounds are normal.  Musculoskeletal:     Cervical back: Neck supple. No rigidity or tenderness.  Lymphadenopathy:     Cervical: No cervical adenopathy.  Neurological:     Mental Status: He is alert and oriented to person, place, and time.  Psychiatric:        Mood and Affect: Mood normal.        Behavior: Behavior normal.     Assessment & Plan:   Problem List Items Addressed This Visit    None    Visit Diagnoses    Essential hypertension    -  Primary   Relevant Medications   chlorthalidone (HYGROTON) 25 MG tablet   Other Relevant Orders   CBC (Completed)   Comprehensive metabolic panel (Completed)   TSH (Completed)   Urinalysis, Routine w reflex microscopic (Completed)   Microalbumin / creatinine urine ratio (Completed)   Healthcare maintenance       Relevant Orders    CBC (Completed)   Comprehensive metabolic panel (Completed)   Lipid panel (Completed)   LDL cholesterol, direct (Completed)   TSH (Completed)  Urinalysis, Routine w reflex microscopic (Completed)   PSA (Completed)   History of gastric bypass       Relevant Orders   VITAMIN D 25 Hydroxy (Vit-D Deficiency, Fractures) (Completed)   Iron, TIBC and Ferritin Panel (Completed)   B12 and Folate Panel (Completed)   B12 deficiency       Relevant Medications   b complex vitamins capsule   Vitamin D deficiency       Relevant Medications   Cholecalciferol (VITAMIN D3) 125 MCG (5000 UT) TABS      Outpatient Encounter Medications as of 09/13/2019  Medication Sig  . b complex vitamins capsule Take 1 capsule by mouth daily.  . chlorthalidone (HYGROTON) 25 MG tablet Take 1 tablet (25 mg total) by mouth daily.  . Cholecalciferol (VITAMIN D3) 125 MCG (5000 UT) TABS Take one daily  . [DISCONTINUED] chlorthalidone (HYGROTON) 25 MG tablet Take 1 tablet (25 mg total) by mouth daily.   No facility-administered encounter medications on file as of 09/13/2019.    Follow-up: Return in about 6 weeks (around 10/25/2019), or check and record bps. Follow up for a physical on return.. Patient was given information on managing hypertension as well as information on chlorthalidone.  Follow-up will be 4 to 6 weeks.  Thanks can  Mliss Sax, MD

## 2019-09-13 NOTE — Patient Instructions (Signed)
Managing Your Hypertension Hypertension is commonly called high blood pressure. This is when the force of your blood pressing against the walls of your arteries is too strong. Arteries are blood vessels that carry blood from your heart throughout your body. Hypertension forces the heart to work harder to pump blood, and may cause the arteries to become narrow or stiff. Having untreated or uncontrolled hypertension can cause heart attack, stroke, kidney disease, and other problems. What are blood pressure readings? A blood pressure reading consists of a higher number over a lower number. Ideally, your blood pressure should be below 120/80. The first ("top") number is called the systolic pressure. It is a measure of the pressure in your arteries as your heart beats. The second ("bottom") number is called the diastolic pressure. It is a measure of the pressure in your arteries as the heart relaxes. What does my blood pressure reading mean? Blood pressure is classified into four stages. Based on your blood pressure reading, your health care provider may use the following stages to determine what type of treatment you need, if any. Systolic pressure and diastolic pressure are measured in a unit called mm Hg. Normal  Systolic pressure: below 120.  Diastolic pressure: below 80. Elevated  Systolic pressure: 120-129.  Diastolic pressure: below 80. Hypertension stage 1  Systolic pressure: 130-139.  Diastolic pressure: 80-89. Hypertension stage 2  Systolic pressure: 140 or above.  Diastolic pressure: 90 or above. What health risks are associated with hypertension? Managing your hypertension is an important responsibility. Uncontrolled hypertension can lead to:  A heart attack.  A stroke.  A weakened blood vessel (aneurysm).  Heart failure.  Kidney damage.  Eye damage.  Metabolic syndrome.  Memory and concentration problems. What changes can I make to manage my  hypertension? Hypertension can be managed by making lifestyle changes and possibly by taking medicines. Your health care provider will help you make a plan to bring your blood pressure within a normal range. Eating and drinking   Eat a diet that is high in fiber and potassium, and low in salt (sodium), added sugar, and fat. An example eating plan is called the DASH (Dietary Approaches to Stop Hypertension) diet. To eat this way: ? Eat plenty of fresh fruits and vegetables. Try to fill half of your plate at each meal with fruits and vegetables. ? Eat whole grains, such as whole wheat pasta, brown rice, or whole grain bread. Fill about one quarter of your plate with whole grains. ? Eat low-fat diary products. ? Avoid fatty cuts of meat, processed or cured meats, and poultry with skin. Fill about one quarter of your plate with lean proteins such as fish, chicken without skin, beans, eggs, and tofu. ? Avoid premade and processed foods. These tend to be higher in sodium, added sugar, and fat.  Reduce your daily sodium intake. Most people with hypertension should eat less than 1,500 mg of sodium a day.  Limit alcohol intake to no more than 1 drink a day for nonpregnant women and 2 drinks a day for men. One drink equals 12 oz of beer, 5 oz of wine, or 1 oz of hard liquor. Lifestyle  Work with your health care provider to maintain a healthy body weight, or to lose weight. Ask what an ideal weight is for you.  Get at least 30 minutes of exercise that causes your heart to beat faster (aerobic exercise) most days of the week. Activities may include walking, swimming, or biking.  Include exercise   to strengthen your muscles (resistance exercise), such as weight lifting, as part of your weekly exercise routine. Try to do these types of exercises for 30 minutes at least 3 days a week.  Do not use any products that contain nicotine or tobacco, such as cigarettes and e-cigarettes. If you need help quitting,  ask your health care provider.  Control any long-term (chronic) conditions you have, such as high cholesterol or diabetes. Monitoring  Monitor your blood pressure at home as told by your health care provider. Your personal target blood pressure may vary depending on your medical conditions, your age, and other factors.  Have your blood pressure checked regularly, as often as told by your health care provider. Working with your health care provider  Review all the medicines you take with your health care provider because there may be side effects or interactions.  Talk with your health care provider about your diet, exercise habits, and other lifestyle factors that may be contributing to hypertension.  Visit your health care provider regularly. Your health care provider can help you create and adjust your plan for managing hypertension. Will I need medicine to control my blood pressure? Your health care provider may prescribe medicine if lifestyle changes are not enough to get your blood pressure under control, and if:  Your systolic blood pressure is 130 or higher.  Your diastolic blood pressure is 80 or higher. Take medicines only as told by your health care provider. Follow the directions carefully. Blood pressure medicines must be taken as prescribed. The medicine does not work as well when you skip doses. Skipping doses also puts you at risk for problems. Contact a health care provider if:  You think you are having a reaction to medicines you have taken.  You have repeated (recurrent) headaches.  You feel dizzy.  You have swelling in your ankles.  You have trouble with your vision. Get help right away if:  You develop a severe headache or confusion.  You have unusual weakness or numbness, or you feel faint.  You have severe pain in your chest or abdomen.  You vomit repeatedly.  You have trouble breathing. Summary  Hypertension is when the force of blood pumping  through your arteries is too strong. If this condition is not controlled, it may put you at risk for serious complications.  Your personal target blood pressure may vary depending on your medical conditions, your age, and other factors. For most people, a normal blood pressure is less than 120/80.  Hypertension is managed by lifestyle changes, medicines, or both. Lifestyle changes include weight loss, eating a healthy, low-sodium diet, exercising more, and limiting alcohol. This information is not intended to replace advice given to you by your health care provider. Make sure you discuss any questions you have with your health care provider. Document Revised: 11/27/2018 Document Reviewed: 07/03/2016 Elsevier Patient Education  [place]. Chlorthalidone; Clonidine oral tablets What is this medicine? CHLORTHALIDONE; CLONIDINE (klor THAL i done ; KLOE ni deen) is a combination of two medicines that are used to treat high blood pressure. Chlorthalidone is a diuretic. It lowers blood pressure and increases the amount of urine passed, which causes the body to lose salt and water. Clonidine also lowers blood pressure. This medicine may be used for other purposes; ask your health care provider or pharmacist if you have questions. COMMON BRAND NAME(S): Clorpres, Combipres What should I tell my health care provider before I take this medicine? They need to know if  you have any of these conditions:  asthma  diabetes  gout  kidney disease  liver disease  systemic lupus erythematosus (SLE)  an unusual or allergic reaction to chlorthalidone, sulfa drugs, clonidine, other medicines, foods, dyes, or preservatives  pregnant or trying to get pregnant  breast-feeding How should I use this medicine? Take this medicine by mouth with a glass of water. Take this medicine with food. Follow the directions on the prescription label. Take your doses at regular intervals. Do not take your medicine  more often than directed. Remember that you will need to pass urine frequently after taking this medicine. Do not suddenly stop taking this medicine. You must gradually reduce the dose or you may get a dangerous increase in blood pressure. Ask your doctor or health care professional for advice. Talk to your pediatrician regarding the use of this medicine in children. Special care may be needed. Overdosage: If you think you have taken too much of this medicine contact a poison control center or emergency room at once. NOTE: This medicine is only for you. Do not share this medicine with others. What if I miss a dose? If you miss a dose, take it as soon as you can. If it is almost time for your next dose, take only that dose. Do not take double or extra doses. What may interact with this medicine?  barbiturate medicines for inducing sleep or treating seizures like phenobarbital  certain medicines for depression, like amitriptyline or imipramine  digoxin  ephedra, Ma Huang  glycyrrhizin (licorice)  lithium  medicines for diabetes  other medicines for high blood pressure  steroid medicines like prednisone or cortisone This list may not describe all possible interactions. Give your health care provider a list of all the medicines, herbs, non-prescription drugs, or dietary supplements you use. Also tell them if you smoke, drink alcohol, or use illegal drugs. Some items may interact with your medicine. What should I watch for while using this medicine? Visit your doctor or health care professional for regular checks on your progress. Check your blood pressure regularly as directed. Ask your doctor or health care professional what your blood pressure should be and when you should contact him or her. You may need blood work done while you are taking this medicine. You may need to be on a special diet while taking this medicine. Ask your doctor. You may get drowsy or dizzy. Do not drive, use  machinery, or do anything that needs mental alertness until you know how this medicine affects you. To avoid dizzy or fainting spells, do not stand or sit up quickly, especially if you are an older person. Alcohol can make you more drowsy and dizzy. Avoid alcoholic drinks. This medicine may affect blood sugar levels. If you have diabetes, check with your doctor or health care professional before you change your diet or the dose of your diabetic medicine. Your mouth may get dry. Chewing sugarless gum or sucking hard candy, and drinking plenty of water may help. Contact your doctor if the problem does not go away or is severe. This medicine can make you more sensitive to the sun. Keep out of the sun. If you cannot avoid being in the sun, wear protective clothing and use sunscreen. Do not use sun lamps or tanning beds/booths. Do not treat yourself for coughs, colds, or pain while you are taking this medicine without asking your doctor or health care professional for advice. Some ingredients may increase your blood pressure. If you  are going to have surgery tell your doctor or health care professional that you are taking this medicine. What side effects may I notice from receiving this medicine? Side effects that you should report to your doctor or health care professional as soon as possible:  allergic reactions like skin rash, itching or hives, swelling of the face, lips, or tongue  anxiety, nervousness  chest pain  depressed mood  fast, irregular heartbeat  feeling faint or lightheaded, falls  increased hunger or thirst  muscle pain, cramps, or spasm  pain or difficulty when passing urine  pain, tingling, numbness in the hands or feet  redness, blistering, peeling or loosening of the skin, including inside the mouth  swelling of feet or legs  unusually weak or tired  yellowing of the eyes or skin Side effects that usually do not require medical attention (report to your doctor or  health care professional if they continue or are bothersome):  change in sex drive or performance  drowsiness  dry mouth  headache  nausea  stomach upset This list may not describe all possible side effects. Call your doctor for medical advice about side effects. You may report side effects to FDA at 1-800-FDA-1088. Where should I keep my medicine? Keep out of the reach of children. Store between 20 and 25 degrees C (68 and 77 degrees F). Protect from moisture. Throw away any unused medicine after the expiration date. NOTE: This sheet is a summary. It may not cover all possible information. If you have questions about this medicine, talk to your doctor, pharmacist, or health care provider.  2020 Elsevier/Gold Standard (2015-09-07 11:18:05)

## 2019-09-14 LAB — IRON,TIBC AND FERRITIN PANEL
%SAT: 29 % (calc) (ref 20–48)
Ferritin: 89 ng/mL (ref 38–380)
Iron: 87 ug/dL (ref 50–180)
TIBC: 303 mcg/dL (calc) (ref 250–425)

## 2019-09-14 LAB — NOVEL CORONAVIRUS, NAA: SARS-CoV-2, NAA: NOT DETECTED

## 2019-09-14 MED ORDER — B COMPLEX VITAMINS PO CAPS: 1.0000 | ORAL_CAPSULE | Freq: Every day | ORAL | 1 refills | Status: AC

## 2019-09-14 MED ORDER — VITAMIN D3 125 MCG (5000 UT) PO TABS: ORAL_TABLET | ORAL | 1 refills | Status: AC

## 2019-09-14 NOTE — Addendum Note (Signed)
Addended by: Andrez Grime on: 09/14/2019 08:18 AM   Modules accepted: Orders

## 2019-10-06 ENCOUNTER — Telehealth: Payer: Self-pay | Admitting: Family Medicine

## 2019-10-06 NOTE — Telephone Encounter (Signed)
Spoke with pt regarding labs, no concerns at this time.

## 2019-10-06 NOTE — Telephone Encounter (Signed)
Patient is returning the call. CB is [phone]

## 2019-10-24 ENCOUNTER — Ambulatory Visit: Payer: BC Managed Care – PPO | Attending: Internal Medicine

## 2019-10-24 DIAGNOSIS — Z23 Encounter for immunization: Secondary | ICD-10-CM

## 2019-10-24 NOTE — Progress Notes (Signed)
   Covid-[facility] Vaccination [facility]  Name:  [name]    MRN: [id number] DOB: 18-Jun-1971  10/24/2019  [name] was observed post Covid-19 immunization for 15 minutes without incident. He was provided with Vaccine Information Sheet and instruction to access the V-Safe system.   [name] was instructed to call 911 with any severe reactions post vaccine: Marland Kitchen Difficulty breathing  . Swelling of face and throat  . A fast heartbeat  . A bad rash all over body  . Dizziness and weakness   Immunizations Administered    Name Date Dose VIS Date Route   Pfizer COVID-19 Vaccine 10/24/2019  1:38 PM 0.3 mL 07/30/2019 Intramuscular   Manufacturer: [employer], [employer]   Lot: [id number]   NDC: [address]-0

## 2019-10-25 ENCOUNTER — Other Ambulatory Visit: Payer: Self-pay

## 2019-10-26 ENCOUNTER — Other Ambulatory Visit: Payer: Self-pay

## 2019-10-26 ENCOUNTER — Encounter: Payer: Self-pay | Admitting: Family Medicine

## 2019-10-26 ENCOUNTER — Ambulatory Visit (INDEPENDENT_AMBULATORY_CARE_PROVIDER_SITE_OTHER): Payer: BC Managed Care – PPO | Admitting: Family Medicine

## 2019-10-26 VITALS — BP 152/108 | HR 89 | Temp 96.2°F | Ht 73.0 in | Wt 288.4 lb

## 2019-10-26 DIAGNOSIS — S161XXA Strain of muscle, fascia and tendon at neck level, initial encounter: Secondary | ICD-10-CM | POA: Insufficient documentation

## 2019-10-26 DIAGNOSIS — I10 Essential (primary) hypertension: Secondary | ICD-10-CM | POA: Insufficient documentation

## 2019-10-26 DIAGNOSIS — E559 Vitamin D deficiency, unspecified: Secondary | ICD-10-CM | POA: Insufficient documentation

## 2019-10-26 DIAGNOSIS — E538 Deficiency of other specified B group vitamins: Secondary | ICD-10-CM | POA: Diagnosis not present

## 2019-10-26 MED ORDER — CHLORTHALIDONE 25 MG PO TABS: 25.0000 mg | ORAL_TABLET | Freq: Every day | ORAL | 0 refills | Status: DC

## 2019-10-26 MED ORDER — MELOXICAM 7.5 MG PO TABS: 7.5000 mg | ORAL_TABLET | Freq: Every day | ORAL | 0 refills | Status: DC

## 2019-10-26 MED ORDER — AMLODIPINE BESYLATE 10 MG PO TABS: 10.0000 mg | ORAL_TABLET | Freq: Every day | ORAL | 0 refills | Status: DC

## 2019-10-26 NOTE — Patient Instructions (Signed)
Cervical Strain and Sprain Rehab Ask your health care provider which exercises are safe for you. Do exercises exactly as told by your health care provider and adjust them as directed. It is normal to feel mild stretching, pulling, tightness, or discomfort as you do these exercises. Stop right away if you feel sudden pain or your pain gets worse. Do not begin these exercises until told by your health care provider. Stretching and range-of-motion exercises Cervical side bending  1. Using good posture, sit on a stable chair or stand up. 2. Without moving your shoulders, slowly tilt your left / right ear to your shoulder until you feel a stretch in the opposite side neck muscles. You should be looking straight ahead. 3. Hold for __________ seconds. 4. Repeat with the other side of your neck. Repeat __________ times. Complete this exercise __________ times a day. Cervical rotation  1. Using good posture, sit on a stable chair or stand up. 2. Slowly turn your head to the side as if you are looking over your left / right shoulder. ? Keep your eyes level with the ground. ? Stop when you feel a stretch along the side and the back of your neck. 3. Hold for __________ seconds. 4. Repeat this by turning to your other side. Repeat __________ times. Complete this exercise __________ times a day. Thoracic extension and pectoral stretch 1. Roll a towel or a small blanket so it is about 4 inches (10 cm) in diameter. 2. Lie down on your back on a firm surface. 3. Put the towel lengthwise, under your spine in the middle of your back. It should not be under your shoulder blades. The towel should line up with your spine from your middle back to your lower back. 4. Put your hands behind your head and let your elbows fall out to your sides. 5. Hold for __________ seconds. Repeat __________ times. Complete this exercise __________ times a day. Strengthening exercises Isometric upper cervical flexion 1. Lie on  your back with a thin pillow behind your head and a small rolled-up towel under your neck. 2. Gently tuck your chin toward your chest and nod your head down to look toward your feet. Do not lift your head off the pillow. 3. Hold for __________ seconds. 4. Release the tension slowly. Relax your neck muscles completely before you repeat this exercise. Repeat __________ times. Complete this exercise __________ times a day. Isometric cervical extension  1. Stand about 6 inches (15 cm) away from a wall, with your back facing the wall. 2. Place a soft object, about 6-8 inches (15-20 cm) in diameter, between the back of your head and the wall. A soft object could be a small pillow, a ball, or a folded towel. 3. Gently tilt your head back and press into the soft object. Keep your jaw and forehead relaxed. 4. Hold for __________ seconds. 5. Release the tension slowly. Relax your neck muscles completely before you repeat this exercise. Repeat __________ times. Complete this exercise __________ times a day. Posture and body mechanics Body mechanics refers to the movements and positions of your body while you do your daily activities. Posture is part of body mechanics. Good posture and healthy body mechanics can help to relieve stress in your body's tissues and joints. Good posture means that your spine is in its natural S-curve position (your spine is neutral), your shoulders are pulled back slightly, and your head is not tipped forward. The following are general guidelines for applying improved   posture and body mechanics to your everyday activities. Sitting  1. When sitting, keep your spine neutral and keep your feet flat on the floor. Use a footrest, if necessary, and keep your thighs parallel to the floor. Avoid rounding your shoulders, and avoid tilting your head forward. 2. When working at a desk or a computer, keep your desk at a height where your hands are slightly lower than your elbows. Slide your  chair under your desk so you are close enough to maintain good posture. 3. When working at a computer, place your monitor at a height where you are looking straight ahead and you do not have to tilt your head forward or downward to look at the screen. Standing   When standing, keep your spine neutral and keep your feet about hip-width apart. Keep a slight bend in your knees. Your ears, shoulders, and hips should line up.  When you do a task in which you stand in one place for a long time, place one foot up on a stable object that is 2-4 inches (5-10 cm) high, such as a footstool. This helps keep your spine neutral. Resting When lying down and resting, avoid positions that are most painful for you. Try to support your neck in a neutral position. You can use a contour pillow or a small rolled-up towel. Your pillow should support your neck but not push on it. This information is not intended to replace advice given to you by your health care provider. Make sure you discuss any questions you have with your health care provider. Document Revised: 11/25/2018 Document Reviewed: 05/06/2018 Elsevier Patient Education  [place].  Hypertension, Adult High blood pressure (hypertension) is when the force of blood pumping through the arteries is too strong. The arteries are the blood vessels that carry blood from the heart throughout the body. Hypertension forces the heart to work harder to pump blood and may cause arteries to become narrow or stiff. Untreated or uncontrolled hypertension can cause a heart attack, heart failure, a stroke, kidney disease, and other problems. A blood pressure reading consists of a higher number over a lower number. Ideally, your blood pressure should be below 120/80. The first ("top") number is called the systolic pressure. It is a measure of the pressure in your arteries as your heart beats. The second ("bottom") number is called the diastolic pressure. It is a measure of  the pressure in your arteries as the heart relaxes. What are the causes? The exact cause of this condition is not known. There are some conditions that result in or are related to high blood pressure. What increases the risk? Some risk factors for high blood pressure are under your control. The following factors may make you more likely to develop this condition:  Smoking.  Having type 2 diabetes mellitus, high cholesterol, or both.  Not getting enough exercise or physical activity.  Being overweight.  Having too much fat, sugar, calories, or salt (sodium) in your diet.  Drinking too much alcohol. Some risk factors for high blood pressure may be difficult or impossible to change. Some of these factors include:  Having chronic kidney disease.  Having a family history of high blood pressure.  Age. Risk increases with age.  Race. You may be at higher risk if you are African American.  Gender. Men are at higher risk than women before age 14. After age 52, women are at higher risk than men.  Having obstructive sleep apnea.  Stress. What  are the signs or symptoms? High blood pressure may not cause symptoms. Very high blood pressure (hypertensive crisis) may cause:  Headache.  Anxiety.  Shortness of breath.  Nosebleed.  Nausea and vomiting.  Vision changes.  Severe chest pain.  Seizures. How is this diagnosed? This condition is diagnosed by measuring your blood pressure while you are seated, with your arm resting on a flat surface, your legs uncrossed, and your feet flat on the floor. The cuff of the blood pressure monitor will be placed directly against the skin of your upper arm at the level of your heart. It should be measured at least twice using the same arm. Certain conditions can cause a difference in blood pressure between your right and left arms. Certain factors can cause blood pressure readings to be lower or higher than normal for a short period of time:   When your blood pressure is higher when you are in a health care provider's office than when you are at home, this is called white coat hypertension. Most people with this condition do not need medicines.  When your blood pressure is higher at home than when you are in a health care provider's office, this is called masked hypertension. Most people with this condition may need medicines to control blood pressure. If you have a high blood pressure reading during one visit or you have normal blood pressure with other risk factors, you may be asked to:  Return on a different day to have your blood pressure checked again.  Monitor your blood pressure at home for 1 week or longer. If you are diagnosed with hypertension, you may have other blood or imaging tests to help your health care provider understand your overall risk for other conditions. How is this treated? This condition is treated by making healthy lifestyle changes, such as eating healthy foods, exercising more, and reducing your alcohol intake. Your health care provider may prescribe medicine if lifestyle changes are not enough to get your blood pressure under control, and if:  Your systolic blood pressure is above 130.  Your diastolic blood pressure is above 80. Your personal target blood pressure may vary depending on your medical conditions, your age, and other factors. Follow these instructions at home: Eating and drinking   Eat a diet that is high in fiber and potassium, and low in sodium, added sugar, and fat. An example eating plan is called the DASH (Dietary Approaches to Stop Hypertension) diet. To eat this way: ? Eat plenty of fresh fruits and vegetables. Try to fill one half of your plate at each meal with fruits and vegetables. ? Eat whole grains, such as whole-wheat pasta, brown rice, or whole-grain bread. Fill about one fourth of your plate with whole grains. ? Eat or drink low-fat dairy products, such as skim milk or  low-fat yogurt. ? Avoid fatty cuts of meat, processed or cured meats, and poultry with skin. Fill about one fourth of your plate with lean proteins, such as fish, chicken without skin, beans, eggs, or tofu. ? Avoid pre-made and processed foods. These tend to be higher in sodium, added sugar, and fat.  Reduce your daily sodium intake. Most people with hypertension should eat less than 1,500 mg of sodium a day.  Do not drink alcohol if: ? Your health care provider tells you not to drink. ? You are pregnant, may be pregnant, or are planning to become pregnant.  If you drink alcohol: ? Limit how much you use to:  0-1 drink a day for women.  0-2 drinks a day for men. ? Be aware of how much alcohol is in your drink. In the [place] one drink equals one 12 oz bottle of beer (355 mL), one 5 oz glass of wine (148 mL), or one 1 oz glass of hard liquor (44 mL). Lifestyle   Work with your health care provider to maintain a healthy body weight or to lose weight. Ask what an ideal weight is for you.  Get at least 30 minutes of exercise most days of the week. Activities may include walking, swimming, or biking.  Include exercise to strengthen your muscles (resistance exercise), such as Pilates or lifting weights, as part of your weekly exercise routine. Try to do these types of exercises for 30 minutes at least 3 days a week.  Do not use any products that contain nicotine or tobacco, such as cigarettes, e-cigarettes, and chewing tobacco. If you need help quitting, ask your health care provider.  Monitor your blood pressure at home as told by your health care provider.  Keep all follow-up visits as told by your health care provider. This is important. Medicines  Take over-the-counter and prescription medicines only as told by your health care provider. Follow directions carefully. Blood pressure medicines must be taken as prescribed.  Do not skip doses of blood pressure medicine. Doing this puts you  at risk for problems and can make the medicine less effective.  Ask your health care provider about side effects or reactions to medicines that you should watch for. Contact a health care provider if you:  Think you are having a reaction to a medicine you are taking.  Have headaches that keep coming back (recurring).  Feel dizzy.  Have swelling in your ankles.  Have trouble with your vision. Get help right away if you:  Develop a severe headache or confusion.  Have unusual weakness or numbness.  Feel faint.  Have severe pain in your chest or abdomen.  Vomit repeatedly.  Have trouble breathing. Summary  Hypertension is when the force of blood pumping through your arteries is too strong. If this condition is not controlled, it may put you at risk for serious complications.  Your personal target blood pressure may vary depending on your medical conditions, your age, and other factors. For most people, a normal blood pressure is less than 120/80.  Hypertension is treated with lifestyle changes, medicines, or a combination of both. Lifestyle changes include losing weight, eating a healthy, low-sodium diet, exercising more, and limiting alcohol. This information is not intended to replace advice given to you by your health care provider. Make sure you discuss any questions you have with your health care provider. Document Revised: 04/15/2018 Document Reviewed: 04/15/2018 Elsevier Patient Education  2020 [employer].  Eating Plan After Bariatric Surgery, Stage 3 A diet after weight loss surgery (bariatric surgery) should provide plenty of fluids and nutrients while promoting weight loss and healing. Each stage of recovery has a different set of food and drink recommendations. Your health care provider may recommend that you work with a diet and nutrition specialist (dietitian) to make a staged eating plan that is right for you. You may start to transition to a stage 3 diet about  5-6 weeks after surgery. During this stage, you will be allowed to eat foods of various textures. Continue to follow the guidelines below until your health care provider or dietitian approves eating a regular weight-maintenance diet. What are tips for following  this plan? Cooking  Use low-fat cooking methods, such as baking, broiling, boiling, or grilling.  Cook using healthy fats, such as olive, sunflower, grapeseed, or canola oil.  Cook and bake using no-calorie sweeteners.  Avoid adding extra salt, sugar, or fat to foods when cooking. Meal planning  Eat 3 meals and 2 snacks, or 5 small meals each day.  Eat at set times. Allow 30-45 minutes for each meal.  Do not skip meals or go a long time without eating (fast). If you are having a hard time eating, talk to your health care provider or dietitian.  Limit food intake to -1 cup per meal. As you heal and advance, you may be able to eat a little more with each meal. Always listen to your body.  Eat foods from all food groups. This includes fruits and vegetables, grains, dairy, and meat and other proteins.  Limit carbohydrate intake to no more than 30 g per meal or 130 g per day. There are about 15-20 g of carbohydrates in 1 piece of bread or a medium piece of fruit. Half of your total grains should be whole grains.  Include a protein-rich food at every meal and snack, and eat the protein food first. Eat 60-80 g of protein a day when possible. General instructions  Work with your dietitian to slowly add new foods to your diet.  Stay hydrated. Drink at least 48-64 oz of noncarbonated, zero-calorie fluid per day. Water is the best choice.  Limit alcohol intake as directed by your health care provider.  Avoid foods that are high in fat or have added sugar.  Take any vitamin supplements as directed by your health care provider.  Ask your dietitian to help you with meal planning and strategies to continue to lose weight and maintain  weight loss. Recommended foods  Fruits  All fresh, frozen, or canned fruit. Vegetables  All fresh, frozen, or canned vegetables. Grains  Whole wheat bread, crackers, and pasta.  Rice.  Unsweetened hot and cold cereals. Meats and other protein foods  Lean meat, poultry, and fish.  Eggs and egg substitutes.  Beans and lentils. Smooth nut butters.  Soy products, such as tempeh and tofu. Dairy  Low or nonfat milk, cheese, and yogurt.  Sugar-free pudding.  Cottage cheese. Beverages  Decaffeinated coffee and tea.  Sugar-free, caffeine-free soft drinks. Fats and oils  Avocado.  Olive, sunflower, grapeseed, or canola oil. Sweets and desserts  Low-fat, sugar-free desserts. Seasoning and other foods  Low-fat, low-sodium condiments. The items listed above may not be a complete list of foods and beverages you can eat. Contact a dietitian for more information. Foods to avoid  Hard-to-digest foods, including: ? Popcorn. ? Nuts and seeds. ? Celery. ? White parts of citrus fruits (pith).  Caffeinated drinks, including: ? Coffee and tea. ? Energy drinks.  Sweets and desserts with more than 25 g of sugar per serving. The items listed above may not be a complete list of foods and beverages you should avoid. Contact a dietitian for more information. Summary  Stage 3 diet starts about 5-6 weeks after surgery. Continue to follow stage 3 guidelines until your health care provider or dietitian approves eating a regular weight-maintenance diet.  Stage 3 diet involves eating a balanced, healthy diet with foods of various textures. This diet helps you continue to lose weight and maintain weight loss.  Eating enough protein and drinking plenty of fluids is important for promoting weight loss and healing after surgery. This  information is not intended to replace advice given to you by your health care provider. Make sure you discuss any questions you have with your health  care provider. Document Revised: 12/24/2018 Document Reviewed: 12/09/2016 Elsevier Patient Education  2020 [employer].

## 2019-10-26 NOTE — Progress Notes (Signed)
Established Patient Office Visit  Subjective:  Patient ID: [name], male    DOB: 14-Oct-1970  Age: 49 y.o. MRN: [id number]  CC:  Chief Complaint  Patient presents with  . Annual Exam    CPE, c/o neck and back pains x 2 months.     HPI [name] presents for follow-up of his hypertension, vitamin D and B12 deficiencies with apparent associated anemia.  History of gastric bypass.  Patient reports compliance with the chlorthalidone.  Blood pressures have been running higher for him with his wrist cuff.  He is cut back on his salt intake.  Has been taking B complex and the 5000 international units of vitamin D daily since his last visit.  He reports a 2-week history of neck and upper back pain.  He has been working hard is shopping and spending long hours in the kitchen.  There is prolonged standing with his job.  Past Medical History:  Diagnosis Date  . Coronary artery disease   . Hypertension     Past Surgical History:  Procedure Laterality Date  . CARDIAC CATHETERIZATION    . CHOLECYSTECTOMY    . GASTRIC BYPASS      Family History  Problem Relation Age of Onset  . Cancer Mother   . Healthy Father   . Diabetes Sister   . Healthy Brother   . Cancer Brother   . Multiple sclerosis Sister     Social History   Socioeconomic History  . Marital status: Single    Spouse name: Not on file  . Number of children: Not on file  . Years of education: Not on file  . Highest education level: Not on file  Occupational History  . Not on file  Tobacco Use  . Smoking status: Never Smoker  . Smokeless tobacco: Never Used  Substance and Sexual Activity  . Alcohol use: Yes    Alcohol/week: 2.0 - 3.0 standard drinks    Types: 2 - 3 Glasses of wine per week    Comment: daily  . Drug use: Never  . Sexual activity: Yes  Other Topics Concern  . Not on file  Social History Narrative  . Not on file   Social Determinants of Health   Financial Resource Strain:   .  Difficulty of Paying Living Expenses: Not on file  Food Insecurity:   . Worried About Charity fundraiser in the Last Year: Not on file  . Ran Out of Food in the Last Year: Not on file  Transportation Needs:   . Lack of Transportation (Medical): Not on file  . Lack of Transportation (Non-Medical): Not on file  Physical Activity:   . Days of Exercise per Week: Not on file  . Minutes of Exercise per Session: Not on file  Stress:   . Feeling of Stress : Not on file  Social Connections:   . Frequency of Communication with Friends and Family: Not on file  . Frequency of Social Gatherings with Friends and Family: Not on file  . Attends Religious Services: Not on file  . Active Member of Clubs or Organizations: Not on file  . Attends Archivist Meetings: Not on file  . Marital Status: Not on file  Intimate Partner Violence:   . Fear of Current or Ex-Partner: Not on file  . Emotionally Abused: Not on file  . Physically Abused: Not on file  . Sexually Abused: Not on file    Outpatient Medications Prior  to Visit  Medication Sig Dispense Refill  . b complex vitamins capsule Take 1 capsule by mouth daily. 90 capsule 1  . Cholecalciferol (VITAMIN D3) 125 MCG (5000 UT) TABS Take one daily 100 tablet 1  . chlorthalidone (HYGROTON) 25 MG tablet Take 1 tablet (25 mg total) by mouth daily. 30 tablet 2   No facility-administered medications prior to visit.    No Known Allergies  ROS Review of Systems  Constitutional: Negative.   HENT: Negative.   Eyes: Negative for photophobia and visual disturbance.  Respiratory: Negative.   Cardiovascular: Negative.   Gastrointestinal: Negative.   Endocrine: Negative for polyphagia and polyuria.  Genitourinary: Negative.   Musculoskeletal: Positive for myalgias and neck pain.  Neurological: Negative for weakness and numbness.  Hematological: Does not bruise/bleed easily.  Psychiatric/Behavioral: Negative.       Objective:    Physical  Exam  Constitutional: He is oriented to person, place, and time. He appears well-developed and well-nourished. No distress.  HENT:  Head: Normocephalic and atraumatic.  Right Ear: External ear normal.  Left Ear: External ear normal.  Eyes: Conjunctivae are normal. Right eye exhibits no discharge. Left eye exhibits no discharge. No scleral icterus.  Neck: No JVD present. No tracheal deviation present.  Cardiovascular: Normal rate, regular rhythm and normal heart sounds.  Pulmonary/Chest: Effort normal and breath sounds normal. No stridor.  Musculoskeletal:     Cervical back: No tenderness or bony tenderness. Decreased range of motion.  Neurological: He is alert and oriented to person, place, and time.  Skin: Skin is warm and dry. He is not diaphoretic.  Psychiatric: He has a normal mood and affect. His behavior is normal.    BP (!) 152/108   Pulse 89   Temp (!) 96.2 F (35.7 C) (Tympanic)   Ht 6\' 1"  (1.854 m)   Wt 288 lb 6.4 oz (130.8 kg)   SpO2 97%   BMI 38.05 kg/m  Wt Readings from Last 3 Encounters:  10/26/19 288 lb 6.4 oz (130.8 kg)  09/13/19 294 lb 3.2 oz (133.4 kg)  08/19/18 (!) 303 lb 1.6 oz (137.5 kg)     There are no preventive care reminders to display for this patient.  There are no preventive care reminders to display for this patient.  Lab Results  Component Value Date   TSH 1.99 09/13/2019   Lab Results  Component Value Date   WBC 4.2 09/13/2019   HGB 11.8 (L) 09/13/2019   HCT 36.6 (L) 09/13/2019   MCV 92.6 09/13/2019   PLT 247.0 09/13/2019   Lab Results  Component Value Date   NA 135 09/13/2019   K 4.4 09/13/2019   CO2 27 09/13/2019   GLUCOSE 99 09/13/2019   BUN 9 09/13/2019   CREATININE 0.89 09/13/2019   BILITOT 0.7 09/13/2019   ALKPHOS 61 09/13/2019   AST 16 09/13/2019   ALT 10 09/13/2019   PROT 7.0 09/13/2019   ALBUMIN 4.0 09/13/2019   CALCIUM 8.6 09/13/2019   GFR 110.06 09/13/2019   Lab Results  Component Value Date   CHOL 131  09/13/2019   Lab Results  Component Value Date   HDL 42.90 09/13/2019   Lab Results  Component Value Date   LDLCALC 67 09/13/2019   Lab Results  Component Value Date   TRIG 108.0 09/13/2019   Lab Results  Component Value Date   CHOLHDL 3 09/13/2019   No results found for: HGBA1C    Assessment & Plan:   Problem List Items  Addressed This Visit      Cardiovascular and Mediastinum   Essential hypertension   Relevant Medications   amLODipine (NORVASC) 10 MG tablet   chlorthalidone (HYGROTON) 25 MG tablet     Musculoskeletal and Integument   Cervical strain   Relevant Medications   meloxicam (MOBIC) 7.5 MG tablet     Other   Vitamin D deficiency   B12 deficiency - Primary      Meds ordered this encounter  Medications  . amLODipine (NORVASC) 10 MG tablet    Sig: Take 1 tablet (10 mg total) by mouth daily.    Dispense:  90 tablet    Refill:  0  . meloxicam (MOBIC) 7.5 MG tablet    Sig: Take 1 tablet (7.5 mg total) by mouth daily.    Dispense:  15 tablet    Refill:  0  . chlorthalidone (HYGROTON) 25 MG tablet    Sig: Take 1 tablet (25 mg total) by mouth daily.    Dispense:  90 tablet    Refill:  0    Follow-up: Return in about 2 months (around 12/26/2019), or Add norvasc and continue Vit D 5000 units and b complex daily. Please do exercises for neck..   Have added amlodipine 10 mg to chlorthalidone to improve BP control.  Patient is compliant with his B complex and 5000 international units of vitamin D daily.  There will be a short course of meloxicam for his neck and upper back pain.  He was given exercises to try.  Discussed posture.   Mliss Sax, MD

## 2019-11-23 ENCOUNTER — Ambulatory Visit: Payer: BC Managed Care – PPO | Attending: Internal Medicine

## 2019-11-23 DIAGNOSIS — Z23 Encounter for immunization: Secondary | ICD-10-CM

## 2019-11-23 NOTE — Progress Notes (Signed)
   Covid-19 Vaccination Clinic  Name:  [name]    MRN: [id number] DOB: 04-05-1971  11/23/2019  [name] was observed post Covid-19 immunization for 15 minutes without incident. He was provided with Vaccine Information Sheet and instruction to access the V-Safe system.   [name] was instructed to call 911 with any severe reactions post vaccine: Marland Kitchen Difficulty breathing  . Swelling of face and throat  . A fast heartbeat  . A bad rash all over body  . Dizziness and weakness   Immunizations Administered    Name Date Dose VIS Date Route   Pfizer COVID-19 Vaccine 11/23/2019  8:14 AM 0.3 mL 07/30/2019 Intramuscular   Manufacturer: [employer], [employer]   Lot: [id number]   NDC: [address]-8

## 2020-01-04 ENCOUNTER — Ambulatory Visit: Payer: BC Managed Care – PPO | Admitting: Family Medicine

## 2020-01-16 ENCOUNTER — Other Ambulatory Visit: Payer: Self-pay | Admitting: Family Medicine

## 2020-01-16 DIAGNOSIS — I10 Essential (primary) hypertension: Secondary | ICD-10-CM

## 2020-03-03 ENCOUNTER — Telehealth: Payer: Self-pay | Admitting: Family Medicine

## 2020-03-03 NOTE — Telephone Encounter (Signed)
Called patient to reschedule missed appt from 01/04/20. Left voicemail for him to call and reschedule at his convenience.

## 2020-04-28 ENCOUNTER — Other Ambulatory Visit: Payer: Self-pay | Admitting: Family Medicine

## 2020-04-28 DIAGNOSIS — I10 Essential (primary) hypertension: Secondary | ICD-10-CM

## 2020-05-09 NOTE — Telephone Encounter (Signed)
Called patient to schedule appointment no answer LMTCB and schedule appointment before refills could be filled.  °

## 2020-07-02 ENCOUNTER — Ambulatory Visit: Payer: BC Managed Care – PPO | Attending: Internal Medicine

## 2020-07-02 DIAGNOSIS — Z23 Encounter for immunization: Secondary | ICD-10-CM

## 2020-07-02 NOTE — Progress Notes (Signed)
   Covid-19 Vaccination Clinic  Name:  [name]    MRN: [id number] DOB: [birth year]  07/02/2020  [name] was observed post Covid-19 immunization for 15 minutes without incident. He was provided with Vaccine Information Sheet and instruction to access the V-Safe system.   [name] was instructed to call 911 with any severe reactions post vaccine: Marland Kitchen Difficulty breathing  . Swelling of face and throat  . A fast heartbeat  . A bad rash all over body  . Dizziness and weakness   Immunizations Administered    Name Date Dose VIS Date Route   Pfizer COVID-19 Vaccine 07/02/2020  9:31 AM 0.3 mL 06/07/2020 Intramuscular   Manufacturer: [employer], [employer]   Lot: [id number]   NDC: [address]-7

## 2020-08-13 DIAGNOSIS — Z03818 Encounter for observation for suspected exposure to other biological agents ruled out: Secondary | ICD-10-CM | POA: Diagnosis not present

## 2020-08-16 ENCOUNTER — Other Ambulatory Visit: Payer: Self-pay | Admitting: Family Medicine

## 2020-08-16 DIAGNOSIS — I10 Essential (primary) hypertension: Secondary | ICD-10-CM

## 2020-08-27 DIAGNOSIS — Z1152 Encounter for screening for COVID-19: Secondary | ICD-10-CM | POA: Diagnosis not present

## 2020-12-16 ENCOUNTER — Encounter: Payer: Self-pay | Admitting: Emergency Medicine

## 2020-12-16 ENCOUNTER — Other Ambulatory Visit: Payer: Self-pay

## 2020-12-16 ENCOUNTER — Ambulatory Visit
Admission: EM | Admit: 2020-12-16 | Discharge: 2020-12-16 | Disposition: A | Discharge status: Home / Self Care | Payer: BC Managed Care – PPO | Attending: Emergency Medicine | Admitting: Emergency Medicine

## 2020-12-16 DIAGNOSIS — M545 Low back pain, unspecified: Secondary | ICD-10-CM

## 2020-12-16 DIAGNOSIS — I10 Essential (primary) hypertension: Secondary | ICD-10-CM

## 2020-12-16 LAB — POCT URINALYSIS DIP (MANUAL ENTRY)
Bilirubin, UA: NEGATIVE
Glucose, UA: NEGATIVE mg/dL
Ketones, POC UA: NEGATIVE mg/dL
Leukocytes, UA: NEGATIVE
Nitrite, UA: NEGATIVE
Protein Ur, POC: 30 mg/dL — AB
Spec Grav, UA: >=1.03 — AB (ref 1.010–1.025)
Urobilinogen, UA: 1 E.U./dL
pH, UA: 5.5 (ref 5.0–8.0)

## 2020-12-16 MED ORDER — CHLORTHALIDONE 25 MG PO TABS: 25.0000 mg | ORAL_TABLET | Freq: Every day | ORAL | 0 refills | Status: AC

## 2020-12-16 MED ORDER — AMLODIPINE BESYLATE 10 MG PO TABS: 1.0000 | ORAL_TABLET | Freq: Every day | ORAL | 0 refills | Status: DC

## 2020-12-16 MED ORDER — NAPROXEN 500 MG PO TABS: 500.0000 mg | ORAL_TABLET | Freq: Two times a day (BID) | ORAL | 0 refills | Status: AC

## 2020-12-16 MED ORDER — TIZANIDINE HCL 2 MG PO TABS: 2.0000 mg | ORAL_TABLET | Freq: Four times a day (QID) | ORAL | 0 refills | Status: AC | PRN

## 2020-12-16 NOTE — ED Triage Notes (Signed)
Pt here for right sided flank to back pain x 1 month that improves with ibuprofen but returns; pt concerned for kidney issue

## 2020-12-16 NOTE — Discharge Instructions (Addendum)
Back Pain -No sign of infection in urine, I am checking her kidney function with blood work-I will call if abnormal -In the meantime please continue to use anti-inflammatories and muscle relaxers for pain-Naprosyn twice daily with food, may supplement with tizanidine at home/bedtime  Hypertension -Please restart both amlodipine and chlorthalidone and keep close monitor blood pressure -Follow-up with primary care/cardiology for further valuation and treatment of blood pressure  Please return if any symptoms not improving or worsening

## 2020-12-16 NOTE — ED Provider Notes (Signed)
[facility]    CSN: [id number] Arrival date & time: 12/16/20  0355      History   Chief Complaint Chief Complaint  Patient presents with  . Back Pain    HPI [name] is a 50 y.o. male history of hypertension, CAD, presenting today for evaluation of back pain.  Reports associated right-sided mid/upper back pain for approximately 1 month.  Pain comes and goes with use of ibuprofen.  Expresses concern over possible kidney cause and kidney infection.  Denies any urinary symptoms of dysuria, increased frequency urgency hesitancy, hematuria.  Denies fevers, nausea or vomiting.  Denies abdominal pain.  Oral intake at baseline.  Declines being on blood pressure medicine currently.  Does report a brief episode of a throbbing sensation in his back/chest, but this resolved spontaneously and denies this at present.  Does feel pain is triggered by certain movements.  HPI  Past Medical History:  Diagnosis Date  . Coronary artery disease   . Hypertension     Patient Active Problem List   Diagnosis Date Noted  . Cervical strain 10/26/2019  . Essential hypertension 10/26/2019  . Vitamin D deficiency 10/26/2019  . B12 deficiency 10/26/2019  . BMI 37.0-37.9, adult 08/21/2013  . Elevated BP 08/21/2013    Past Surgical History:  Procedure Laterality Date  . CARDIAC CATHETERIZATION    . CHOLECYSTECTOMY    . GASTRIC BYPASS         Home Medications    Prior to Admission medications   Medication Sig Start Date End Date Taking? Authorizing Provider  naproxen (NAPROSYN) 500 MG tablet Take 1 tablet (500 mg total) by mouth 2 (two) times daily. 12/16/20  Yes Dachelle Molzahn C, PA-C  tiZANidine (ZANAFLEX) 2 MG tablet Take 1-2 tablets (2-4 mg total) by mouth every 6 (six) hours as needed for muscle spasms. 12/16/20  Yes Daimien Patmon C, PA-C  amLODipine (NORVASC) 10 MG tablet Take 1 tablet (10 mg total) by mouth daily. 12/16/20   Starkeisha Vanwinkle C, PA-C  b complex vitamins  capsule Take 1 capsule by mouth daily. 09/14/19   Mliss Sax, MD  chlorthalidone (HYGROTON) 25 MG tablet Take 1 tablet (25 mg total) by mouth daily. 12/16/20   Gerasimos Plotts C, PA-C  Cholecalciferol (VITAMIN D3) 125 MCG (5000 UT) TABS Take one daily 09/14/19   Mliss Sax, MD  meloxicam (MOBIC) 7.5 MG tablet Take 1 tablet (7.5 mg total) by mouth daily. 10/26/19   Mliss Sax, MD    Family History Family History  Problem Relation Age of Onset  . Cancer Mother   . Healthy Father   . Diabetes Sister   . Healthy Brother   . Cancer Brother   . Multiple sclerosis Sister     Social History Social History   Tobacco Use  . Smoking status: Never Smoker  . Smokeless tobacco: Never Used  Vaping Use  . Vaping Use: Never used  Substance Use Topics  . Alcohol use: Yes    Alcohol/week: 2.0 - 3.0 standard drinks    Types: 2 - 3 Glasses of wine per week    Comment: daily  . Drug use: Never     Allergies   Patient has no known allergies.   Review of Systems Review of Systems  Constitutional: Negative for fatigue and fever.  Eyes: Negative for redness, itching and visual disturbance.  Respiratory: Negative for shortness of breath.   Cardiovascular: Negative for chest pain and leg swelling.  Gastrointestinal:  Negative for nausea and vomiting.  Musculoskeletal: Positive for back pain and myalgias. Negative for arthralgias.  Skin: Negative for color change, rash and wound.  Neurological: Negative for dizziness, syncope, weakness, light-headedness and headaches.     Physical Exam Triage Vital Signs ED Triage Vitals  Enc Vitals Group     BP      Pulse      Resp      Temp      Temp src      SpO2      Weight      Height      Head Circumference      Peak Flow      Pain Score      Pain Loc      Pain Edu?      Excl. in GC?    No data found.  Updated Vital Signs BP (!) 224/139 (BP Location: Left Arm)   Pulse 79   Temp 98.1 F (36.7 C) (Oral)    Resp 18   SpO2 98%   Visual Acuity Right Eye Distance:   Left Eye Distance:   Bilateral Distance:    Right Eye Near:   Left Eye Near:    Bilateral Near:     Physical Exam Vitals and nursing note reviewed.  Constitutional:      Appearance: He is well-developed.     Comments: No acute distress  HENT:     Head: Normocephalic and atraumatic.     Nose: Nose normal.  Eyes:     Conjunctiva/sclera: Conjunctivae normal.  Cardiovascular:     Rate and Rhythm: Normal rate.  Pulmonary:     Effort: Pulmonary effort is normal. No respiratory distress.  Abdominal:     General: There is no distension.  Musculoskeletal:        General: Normal range of motion.     Cervical back: Neck supple.     Comments: Nontender palpation of cervical, thoracic and lumbar spine midline, no reproducible to tenderness to palpation in right thoracic or lumbar musculature, patient points to right lower thoracic/upper lumbar area more laterally as source of discomfort  Skin:    General: Skin is warm and dry.  Neurological:     Mental Status: He is alert and oriented to person, place, and time.      UC Treatments / Results  Labs (all labs ordered are listed, but only abnormal results are displayed) Labs Reviewed  POCT URINALYSIS DIP (MANUAL ENTRY) - Abnormal; Notable for the following components:      Result Value   Clarity, UA cloudy (*)    Spec Grav, UA >=1.030 (*)    Blood, UA trace-intact (*)    Protein Ur, POC =30 (*)    All other components within normal limits  COMPREHENSIVE METABOLIC PANEL    EKG   Radiology No results found.  Procedures Procedures (including critical care time)  Medications Ordered in UC Medications - No data to display  Initial Impression / Assessment and Plan / UC Course  I have reviewed the triage vital signs and the nursing notes.  Pertinent labs & imaging results that were available during my care of the patient were reviewed by me and considered in my  medical decision making (see chart for details).  Clinical Course as of 12/16/20 0932  Sat Dec 16, 2020  7253 198/135 Left arm sitting large cuff [HW]    Clinical Course User Index [HW] Rashaun Wichert C, PA-C    UA with  negative leuks and nitrites, trace hemoglobin, will check CMP to check kidney function.  Recommending continued use of anti-inflammatories for muscular etiology of back pain, if kidney function abnormal will call and have stop NSAIDs and will transition to Tylenol/prednisone.  Refilling blood pressure medicine, encouraged to take regularly and follow-up with PCP if blood pressure continuing to not be controlled.  Discussed strict return precautions. Patient verbalized understanding and is agreeable with plan.  Final Clinical Impressions(s) / UC Diagnoses   Final diagnoses:  Acute right-sided low back pain without sciatica     Discharge Instructions     Back Pain -No sign of infection in urine, I am checking her kidney function with blood work-I will call if abnormal -In the meantime please continue to use anti-inflammatories and muscle relaxers for pain-Naprosyn twice daily with food, may supplement with tizanidine at home/bedtime  Hypertension -Please restart both amlodipine and chlorthalidone and keep close monitor blood pressure -Follow-up with primary care/cardiology for further valuation and treatment of blood pressure  Please return if any symptoms not improving or worsening    ED Prescriptions    Medication Sig Dispense Auth. Provider   naproxen (NAPROSYN) 500 MG tablet Take 1 tablet (500 mg total) by mouth 2 (two) times daily. 30 tablet Seraphine Gudiel C, PA-C   amLODipine (NORVASC) 10 MG tablet Take 1 tablet (10 mg total) by mouth daily. 90 tablet Buzz Axel C, PA-C   chlorthalidone (HYGROTON) 25 MG tablet Take 1 tablet (25 mg total) by mouth daily. 90 tablet Kathyjo Briere C, PA-C   tiZANidine (ZANAFLEX) 2 MG tablet Take 1-2 tablets (2-4 mg  total) by mouth every 6 (six) hours as needed for muscle spasms. 30 tablet Zephyr Sausedo, [place] C, PA-C     PDMP not reviewed this encounter.   Lew Dawes, [place] 12/16/20 [id number]

## 2020-12-18 ENCOUNTER — Other Ambulatory Visit: Payer: BC Managed Care – PPO

## 2020-12-18 LAB — COMPREHENSIVE METABOLIC PANEL
ALT: 12 IU/L (ref 0–44)
AST: 19 IU/L (ref 0–40)
Albumin/Globulin Ratio: 1.3 (ref 1.2–2.2)
Albumin: 4 g/dL (ref 4.0–5.0)
Alkaline Phosphatase: 79 IU/L (ref 44–121)
BUN/Creatinine Ratio: 12 (ref 9–20)
BUN: 13 mg/dL (ref 6–24)
Bilirubin Total: 0.6 mg/dL (ref 0.0–1.2)
CO2: 22 mmol/L (ref 20–29)
Calcium: 8.8 mg/dL (ref 8.7–10.2)
Chloride: 99 mmol/L (ref 96–106)
Creatinine, Ser: 1.05 mg/dL (ref 0.76–1.27)
Globulin, Total: 3.2 g/dL (ref 1.5–4.5)
Glucose: 102 mg/dL — ABNORMAL HIGH (ref 65–99)
Potassium: 3.9 mmol/L (ref 3.5–5.2)
Sodium: 135 mmol/L (ref 134–144)
Total Protein: 7.2 g/dL (ref 6.0–8.5)
eGFR: 87 mL/min/{1.73_m2} (ref >59)

## 2021-02-12 ENCOUNTER — Ambulatory Visit: Payer: BC Managed Care – PPO | Attending: Internal Medicine

## 2021-02-12 DIAGNOSIS — Z20822 Contact with and (suspected) exposure to covid-19: Secondary | ICD-10-CM | POA: Diagnosis not present

## 2021-02-13 LAB — NOVEL CORONAVIRUS, NAA: SARS-CoV-2, NAA: NOT DETECTED

## 2021-02-13 LAB — SPECIMEN STATUS REPORT

## 2021-02-13 LAB — SARS-COV-2, NAA 2 DAY TAT

## 2022-12-21 ENCOUNTER — Ambulatory Visit
Admission: EM | Admit: 2022-12-21 | Discharge: 2022-12-21 | Disposition: A | Discharge status: Home / Self Care | Payer: 59 | Attending: Internal Medicine | Admitting: Internal Medicine

## 2022-12-21 DIAGNOSIS — L03012 Cellulitis of left finger: Secondary | ICD-10-CM | POA: Diagnosis not present

## 2022-12-21 DIAGNOSIS — R030 Elevated blood-pressure reading, without diagnosis of hypertension: Secondary | ICD-10-CM

## 2022-12-21 MED ORDER — CEPHALEXIN 500 MG PO CAPS: 500.0000 mg | ORAL_CAPSULE | Freq: Four times a day (QID) | ORAL | 0 refills | Status: AC

## 2022-12-21 NOTE — ED Provider Notes (Signed)
[facility]    CSN: [id number] Arrival date & time: 12/21/22  1118      History   Chief Complaint Chief Complaint  Patient presents with   Skin Problem    HPI [name] is a 52 y.o. male.   Patient presents today due to concern of infection surrounding the nail of the left ring finger.  Patient reports some purulent drainage, swelling, erythema.  Denies any fever.  Denies any injury to the area.  Blood pressure is also significantly elevated but he reports that he was recently prescribed amlodipine but has not yet picked it up from pharmacy.  Patient denies chest pain, shortness of breath, headache, dizziness, blurred vision, nausea, vomiting.     Past Medical History:  Diagnosis Date   Coronary artery disease    Hypertension     Patient Active Problem List   Diagnosis Date Noted   Cervical strain 10/26/2019   Essential hypertension 10/26/2019   Vitamin D deficiency 10/26/2019   B12 deficiency 10/26/2019   BMI 37.0-37.9, adult 08/21/2013   Elevated BP 08/21/2013    Past Surgical History:  Procedure Laterality Date   CARDIAC CATHETERIZATION     CHOLECYSTECTOMY     GASTRIC BYPASS         Home Medications    Prior to Admission medications   Medication Sig Start Date End Date Taking? Authorizing Provider  cephALEXin (KEFLEX) 500 MG capsule Take 1 capsule (500 mg total) by mouth 4 (four) times daily for 5 days. 12/21/22 12/26/22 Yes Heena Woodbury, Acie Fredrickson, FNP  amLODipine (NORVASC) 10 MG tablet Take 1 tablet (10 mg total) by mouth daily. 12/16/20   Wieters, Hallie C, PA-C  b complex vitamins capsule Take 1 capsule by mouth daily. 09/14/19   Mliss Sax, MD  chlorthalidone (HYGROTON) 25 MG tablet Take 1 tablet (25 mg total) by mouth daily. 12/16/20   Wieters, Hallie C, PA-C  Cholecalciferol (VITAMIN D3) 125 MCG (5000 UT) TABS Take one daily 09/14/19   Mliss Sax, MD  meloxicam (MOBIC) 7.5 MG tablet Take 1 tablet (7.5 mg total) by mouth  daily. 10/26/19   Mliss Sax, MD  naproxen (NAPROSYN) 500 MG tablet Take 1 tablet (500 mg total) by mouth 2 (two) times daily. 12/16/20   Wieters, Hallie C, PA-C  tiZANidine (ZANAFLEX) 2 MG tablet Take 1-2 tablets (2-4 mg total) by mouth every 6 (six) hours as needed for muscle spasms. 12/16/20   Wieters, Junius Creamer, PA-C    Family History Family History  Problem Relation Age of Onset   Cancer Mother    Healthy Father    Diabetes Sister    Healthy Brother    Cancer Brother    Multiple sclerosis Sister     Social History Social History   Tobacco Use   Smoking status: Never   Smokeless tobacco: Never  Vaping Use   Vaping Use: Never used  Substance Use Topics   Alcohol use: Yes    Alcohol/week: 2.0 - 3.0 standard drinks of alcohol    Types: 2 - 3 Glasses of wine per week    Comment: daily   Drug use: Never     Allergies   Patient has no known allergies.   Review of Systems Review of Systems Per HPI  Physical Exam Triage Vital Signs ED Triage Vitals  Enc Vitals Group     BP 12/21/22 1156 (!) 190/137     Pulse Rate 12/21/22 1156 87  Resp 12/21/22 1156 18     Temp 12/21/22 1156 98.1 F (36.7 C)     Temp Source 12/21/22 1156 Oral     SpO2 12/21/22 1156 98 %     Weight 12/21/22 1153 264 lb (119.7 kg)     Height 12/21/22 1153 6' (1.829 m)     Head Circumference --      Peak Flow --      Pain Score 12/21/22 1153 0     Pain Loc --      Pain Edu? --      Excl. in GC? --    No data found.  Updated Vital Signs BP (!) 191/135 (BP Location: Right Arm) Comment: Recheck. Recently represcribed BP medicine by PCP hasn't picked up yet  Pulse 92   Temp 98.1 F (36.7 C) (Oral)   Resp 18   Ht 6' (1.829 m)   Wt 264 lb (119.7 kg)   SpO2 98%   BMI 35.80 kg/m   Visual Acuity Right Eye Distance:   Left Eye Distance:   Bilateral Distance:    Right Eye Near:   Left Eye Near:    Bilateral Near:     Physical Exam Constitutional:      General: He is not in  acute distress.    Appearance: Normal appearance. He is not toxic-appearing or diaphoretic.  HENT:     Head: Normocephalic and atraumatic.  Eyes:     Extraocular Movements: Extraocular movements intact.     Conjunctiva/sclera: Conjunctivae normal.  Pulmonary:     Effort: Pulmonary effort is normal.  Skin:    Comments: Patient has erythema and mild swelling present to the distal end of the left ring finger surrounding the nail and cuticle.  No area of fluctuance.  No obvious drainage noted at this time.  Patient has full range of motion of finger.  Capillary refill and pulses intact.  Neurological:     General: No focal deficit present.     Mental Status: He is alert and oriented to person, place, and time. Mental status is at baseline.     Cranial Nerves: Cranial nerves 2-12 are intact.     Sensory: Sensation is intact.     Motor: Motor function is intact.     Coordination: Coordination is intact.     Gait: Gait is intact.  Psychiatric:        Mood and Affect: Mood normal.        Behavior: Behavior normal.        Thought Content: Thought content normal.        Judgment: Judgment normal.      UC Treatments / Results  Labs (all labs ordered are listed, but only abnormal results are displayed) Labs Reviewed - No data to display  EKG   Radiology No results found.  Procedures Procedures (including critical care time)  Medications Ordered in UC Medications - No data to display  Initial Impression / Assessment and Plan / UC Course  I have reviewed the triage vital signs and the nursing notes.  Pertinent labs & imaging results that were available during my care of the patient were reviewed by me and considered in my medical decision making (see chart for details).     Patient has a paronychia of the left ring finger.  Will treat with cephalexin.  Also advised follow-up if symptoms persist or worsen.  No area conducive to drainage at this time.  Blood pressure significantly  elevated with recheck  being similar.  He reports that he is already prescribed blood pressure medication that he is going to pick up from pharmacy today.  Advised him to start taking this immediately and monitor blood pressure very diligently over the next few days.  Encouraged follow-up with urgent care or ER if blood pressure remains elevated.  He is asymptomatic regarding blood pressure so do not think that emergent evaluation is necessary at this time.  Patient verbalized understanding and was agreeable with plan. Final Clinical Impressions(s) / UC Diagnoses   Final diagnoses:  Paronychia of left ring finger  Elevated blood pressure reading     Discharge Instructions      I have prescribed you an antibiotic for infection of your finger.  Follow-up if any symptoms persist or worsen.  Please take your blood pressure medication immediately and monitor blood pressure closely at home.    ED Prescriptions     Medication Sig Dispense Auth. Provider   cephALEXin (KEFLEX) 500 MG capsule Take 1 capsule (500 mg total) by mouth 4 (four) times daily for 5 days. 20 capsule Gustavus Bryant, [place]      PDMP not reviewed this encounter.   Gustavus Bryant, [place] 12/21/22 1336

## 2022-12-21 NOTE — ED Triage Notes (Signed)
"  Possibly infected nail bed, fingernail, left hand, ring finger (4th digit)". Noticed in red/draining and "infected" yesterday. No fever. "Has redness/swelling/discharge".

## 2022-12-21 NOTE — Discharge Instructions (Signed)
I have prescribed you an antibiotic for infection of your finger.  Follow-up if any symptoms persist or worsen.  Please take your blood pressure medication immediately and monitor blood pressure closely at home.

## 2024-06-02 ENCOUNTER — Ambulatory Visit (INDEPENDENT_AMBULATORY_CARE_PROVIDER_SITE_OTHER)

## 2024-06-02 ENCOUNTER — Ambulatory Visit
Admission: EM | Admit: 2024-06-02 | Discharge: 2024-06-02 | Disposition: A | Discharge status: Home / Self Care | Attending: Internal Medicine | Admitting: Internal Medicine

## 2024-06-02 ENCOUNTER — Encounter: Payer: Self-pay | Admitting: Emergency Medicine

## 2024-06-02 DIAGNOSIS — I10 Essential (primary) hypertension: Secondary | ICD-10-CM | POA: Diagnosis not present

## 2024-06-02 DIAGNOSIS — M25572 Pain in left ankle and joints of left foot: Secondary | ICD-10-CM

## 2024-06-02 DIAGNOSIS — R45851 Suicidal ideations: Secondary | ICD-10-CM | POA: Diagnosis not present

## 2024-06-02 DIAGNOSIS — S161XXA Strain of muscle, fascia and tendon at neck level, initial encounter: Secondary | ICD-10-CM

## 2024-06-02 MED ORDER — MELOXICAM 7.5 MG PO TABS: 7.5000 mg | ORAL_TABLET | Freq: Every day | ORAL | 0 refills | Status: AC

## 2024-06-02 MED ORDER — AMLODIPINE BESYLATE 10 MG PO TABS: 10.0000 mg | ORAL_TABLET | Freq: Every day | ORAL | 0 refills | Status: AC

## 2024-06-02 NOTE — ED Provider Notes (Signed)
 [facility]    CSN: [id number] Arrival date & time: 06/02/24  1055      History   Chief Complaint Chief Complaint  Patient presents with   Ankle Pain    HPI [name] is a 53 y.o. male.   [name] is a 53 y.o. male presenting for chief complaint of Ankle Pain that started abruptly this morning upon standing.  Left ankle pain is localized to the inferior left lateral malleolus and improved significantly after he got in the shower with the hot water.  Pain when he initially stood up this morning from bed was severe, pain has improved at this time and he is ambulatory with steady gait with mild limp.  Denies recent trauma/injuries to the left ankle, previous injury to the left ankle, paresthesias distally, color/temperature changes to the left ankle, and swelling.  He has never had gout in the past but he is concerned that he may have gout of the left ankle. He has not taken any OTC medications to help with pain prior to arrival.   Of note, patient answered yes to one of the suicidal ideation screening questions in triage per RN. Patient states he is having fleeting and frequent thoughts of suicide that mostly happen when he drinks alcohol.  He is an alcoholic and went through a recovery program in March/April 2025.  He recently relapsed and began drinking again.  SI thoughts mostly happen when he drinks alcohol.  He reports significant stress at work and he currently works 2 jobs with long hours.  Denies homicidal ideation.  He does not currently have a plan and has never attempted suicide.  He does not currently take any medications for anxiety/depression.  Denies auditory/visual hallucinations, tremor, and seizure-like activity.  History of hypertension, blood pressure is elevated at 179/118 in triage.  He has not taken his blood pressure medications for the last few months since he did not get them refilled by his PCP. Denies CP, SOB, palpitations, dizziness,  extremity weakness, headache, vision changes, and paresthesias.     Ankle Pain   Past Medical History:  Diagnosis Date   Coronary artery disease    Hypertension     Patient Active Problem List   Diagnosis Date Noted   Cervical strain 10/26/2019   Essential hypertension 10/26/2019   Vitamin D  deficiency 10/26/2019   B12 deficiency 10/26/2019   BMI 37.0-37.9, adult 08/21/2013   Elevated BP 08/21/2013    Past Surgical History:  Procedure Laterality Date   CARDIAC CATHETERIZATION     CHOLECYSTECTOMY     GASTRIC BYPASS         Home Medications    Prior to Admission medications   Medication Sig Start Date End Date Taking? Authorizing Provider  amLODipine  (NORVASC ) 10 MG tablet Take 1 tablet (10 mg total) by mouth daily. 06/02/24   Enedelia Dorna HERO, FNP  b complex vitamins capsule Take 1 capsule by mouth daily. Patient not taking: Reported on 06/02/2024 09/14/19   Berneta Elsie Sayre, MD  chlorthalidone  (HYGROTON ) 25 MG tablet Take 1 tablet (25 mg total) by mouth daily. Patient not taking: Reported on 06/02/2024 12/16/20   Wieters, Hallie C, PA-C  Cholecalciferol (VITAMIN D3) 125 MCG (5000 UT) TABS Take one daily Patient not taking: Reported on 06/02/2024 09/14/19   Berneta Elsie Sayre, MD  meloxicam  (MOBIC ) 7.5 MG tablet Take 1 tablet (7.5 mg total) by mouth daily. 06/02/24   Enedelia Dorna HERO, FNP  naproxen  (NAPROSYN ) 500 MG  tablet Take 1 tablet (500 mg total) by mouth 2 (two) times daily. Patient not taking: Reported on 06/02/2024 12/16/20   Wieters, Hallie C, PA-C  tiZANidine  (ZANAFLEX ) 2 MG tablet Take 1-2 tablets (2-4 mg total) by mouth every 6 (six) hours as needed for muscle spasms. Patient not taking: Reported on 06/02/2024 12/16/20   Wieters, Hallie C, PA-C    Family History Family History  Problem Relation Age of Onset   Cancer Mother    Healthy Father    Diabetes Sister    Healthy Brother    Cancer Brother    Multiple sclerosis Sister      Social History Social History   Tobacco Use   Smoking status: Never   Smokeless tobacco: Never  Vaping Use   Vaping status: Never Used  Substance Use Topics   Alcohol use: Yes    Alcohol/week: 2.0 - 3.0 standard drinks of alcohol    Types: 2 - 3 Glasses of wine per week    Comment: daily   Drug use: Never     Allergies   Patient has no known allergies.   Review of Systems Review of Systems Per HPI  Physical Exam Triage Vital Signs ED Triage Vitals  Encounter Vitals Group     BP 06/02/24 1113 (!) 179/118     Girls Systolic BP Percentile --      Girls Diastolic BP Percentile --      Boys Systolic BP Percentile --      Boys Diastolic BP Percentile --      Pulse Rate 06/02/24 1113 92     Resp 06/02/24 1113 18     Temp 06/02/24 1113 98.3 F (36.8 C)     Temp Source 06/02/24 1113 Oral     SpO2 --      Weight 06/02/24 1115 250 lb (113.4 kg)     Height 06/02/24 1115 6' (1.829 m)     Head Circumference --      Peak Flow --      Pain Score 06/02/24 1115 0     Pain Loc --      Pain Education --      Exclude from Growth Chart --    No data found.  Updated Vital Signs BP (!) 179/118 (BP Location: Right Arm)   Pulse 92   Temp 98.3 F (36.8 C) (Oral)   Resp 18   Ht 6' (1.829 m)   Wt 250 lb (113.4 kg)   BMI 33.91 kg/m   Visual Acuity Right Eye Distance:   Left Eye Distance:   Bilateral Distance:    Right Eye Near:   Left Eye Near:    Bilateral Near:     Physical Exam   UC Treatments / Results  Labs (all labs ordered are listed, but only abnormal results are displayed) Labs Reviewed - No data to display  EKG   Radiology DG Ankle Complete Left Result Date: 06/02/2024 EXAM: 3 or more VIEW(S) XRAY OF THE LEFT ANKLE 06/02/2024 12:10:44 PM CLINICAL HISTORY: Left ankle pain onset this morning with weight bearing activity, no injury, pain localized to the lateral malleolus. COMPARISON: None available. FINDINGS: BONES AND JOINTS: No acute fracture. No  focal osseous lesion. No joint dislocation. SOFT TISSUES: The soft tissues are unremarkable. IMPRESSION: 1. No acute osseous abnormality. Electronically signed by: Lynwood Seip MD 06/02/2024 12:34 PM EDT RP Workstation: [id number]    Procedures Procedures (including critical care time)  Medications Ordered in UC Medications - No data  to display  Initial Impression / Assessment and Plan / UC Course  I have reviewed the triage vital signs and the nursing notes.  Pertinent labs & imaging results that were available during my care of the patient were reviewed by me and considered in my medical decision making (see chart for details).   1.  Acute left ankle pain Suspect left ankle pain is due to osteoarthritis. Left ankle x-ray is unremarkable for acute bony abnormality on my read and on radiology reread. Patient is ambulatory and neurovascularly intact to distal affected extremity. Low suspicion for acute gouty arthritis/septic arthritis. We will treat this with Mobic  7.5 mg daily for pain.  No NSAIDs.  Rest, ice/heat, and elevation recommended. Recommend use of supportive shoes.  2.  Elevated blood pressure reading in office with diagnosis of hypertension BP Readings from Last 3 Encounters:  06/02/24 (!) 179/118  12/21/22 (!) 191/135  12/16/20 (!) 224/139   BP noted to be elevated in triage as well as with re-check. Patient is not currently taking any antihypertensive medications and would benefit from this to reduce sustained elevated BP.  Amlodipine  10 mg daily to be restarted today. Discussed risks of sustained elevated blood pressure without intervention, lifestyle and dietary changes to naturally reduce BP, and BP monitoring at home. BP diary recommended.  No red flag signs/symptoms indicating need for referral to ED.  Advised to schedule an appointment with PCP for ongoing management of hypertension.     3.  Suicidal ideation We discussed his suicidal thoughts and depression  symptoms at length.  He is currently in the low risk category, however he would benefit greatly from PCP follow-up and psychiatric referral to help him with alcoholism/suicidality and depression. Patient given resources for behavioral health urgent care and advised to go there for mental health crisis.   We discussed cessation of alcohol will help with symptoms, he is open to scheduling an appointment with PCP to discuss management of depression and alcoholism further.    Strict ER and urgent care precautions discussed.   Counseled patient on potential for adverse effects with medications prescribed/recommended today, strict ER and return-to-clinic precautions discussed, patient verbalized understanding.    Final Clinical Impressions(s) / UC Diagnoses   Final diagnoses:  Acute left ankle pain  Suicidal ideation  Elevated blood pressure reading in office with diagnosis of hypertension     Discharge Instructions      Your ankle x-ray looks great, no signs of broken bones. There are signs of arthritis to the lateral ankle where you are experiencing pain.  Please take Mobic  7.5 mg daily for pain.  Do not take any ibuprofen/NSAIDs.   Take amlodipine  10 mg every day for blood pressure.  Follow-up with your primary care provider to discuss ongoing management of your blood pressure further.  Please go to the nearest emergency room if you develop new/worsening thoughts of harming yourself or if you are experiencing a mental health crisis. You may also go to the [facility] behavioral health urgent care for assistance.   Please schedule an appointment with your primary care provider for a soon as possible to discuss your high blood pressure and mental health management.   Hang in there!      ED Prescriptions     Medication Sig Dispense Auth. Provider   amLODipine  (NORVASC ) 10 MG tablet Take 1 tablet (10 mg total) by mouth daily. 90 tablet Enedelia Dorna HERO, FNP    meloxicam  (MOBIC ) 7.5 MG tablet Take 1 tablet (  7.5 mg total) by mouth daily. 15 tablet Enedelia Dorna HERO, FNP      PDMP not reviewed this encounter.   Enedelia Dorna HERO, [place] 06/02/24 1306

## 2024-06-02 NOTE — Discharge Instructions (Addendum)
 Your ankle x-ray looks great, no signs of broken bones. There are signs of arthritis to the lateral ankle where you are experiencing pain.  Please take Mobic  7.5 mg daily for pain.  Do not take any ibuprofen/NSAIDs.   Take amlodipine  10 mg every day for blood pressure.  Follow-up with your primary care provider to discuss ongoing management of your blood pressure further.  Please go to the nearest emergency room if you develop new/worsening thoughts of harming yourself or if you are experiencing a mental health crisis. You may also go to the [facility] behavioral health urgent care for assistance.   Please schedule an appointment with your primary care provider for a soon as possible to discuss your high blood pressure and mental health management.   Hang in there!

## 2024-06-02 NOTE — ED Triage Notes (Signed)
 Pt st's he ran out of his HTN meds in April and didn't get them refilled

## 2024-06-02 NOTE — ED Triage Notes (Signed)
 Pt c/o pain in left ankle.  Onset this am when he woke up  Pt denies any injury
